# Patient Record
Sex: Male | Born: 1950
Health system: Southern US, Community
[De-identification: ages and names within clinical notes are randomized; demographics above are authoritative.]

## PROBLEM LIST (undated history)

## (undated) DIAGNOSIS — K760 Fatty (change of) liver, not elsewhere classified: Secondary | ICD-10-CM

## (undated) DIAGNOSIS — E785 Hyperlipidemia, unspecified: Secondary | ICD-10-CM

## (undated) DIAGNOSIS — I700 Atherosclerosis of aorta: Secondary | ICD-10-CM

## (undated) DIAGNOSIS — G4730 Sleep apnea, unspecified: Secondary | ICD-10-CM

## (undated) DIAGNOSIS — K219 Gastro-esophageal reflux disease without esophagitis: Secondary | ICD-10-CM

## (undated) DIAGNOSIS — I10 Essential (primary) hypertension: Secondary | ICD-10-CM

## (undated) HISTORY — DX: Hyperlipidemia, unspecified: E78.5

## (undated) HISTORY — PX: TONSILLECTOMY: SUR1361

## (undated) HISTORY — DX: Fatty (change of) liver, not elsewhere classified: K76.0

## (undated) HISTORY — DX: Atherosclerosis of aorta: I70.0

## (undated) HISTORY — PX: UPPER GASTROINTESTINAL ENDOSCOPY: SHX188

## (undated) HISTORY — PX: COLONOSCOPY: SHX174

## (undated) HISTORY — DX: Sleep apnea, unspecified: G47.30

## (undated) HISTORY — DX: Gastro-esophageal reflux disease without esophagitis: K21.9

## (undated) HISTORY — DX: Essential (primary) hypertension: I10

---

## 1998-01-29 ENCOUNTER — Inpatient Hospital Stay (HOSPITAL_COMMUNITY): Admission: RE | Admit: 1998-01-29 | Discharge: 1998-02-02 | Payer: Self-pay | Admitting: *Deleted

## 2002-09-08 ENCOUNTER — Inpatient Hospital Stay (HOSPITAL_COMMUNITY): Admission: EM | Admit: 2002-09-08 | Discharge: 2002-09-12 | Payer: Self-pay | Admitting: Psychiatry

## 2012-07-16 ENCOUNTER — Emergency Department (HOSPITAL_BASED_OUTPATIENT_CLINIC_OR_DEPARTMENT_OTHER): Payer: BC Managed Care – PPO

## 2012-07-16 ENCOUNTER — Emergency Department (HOSPITAL_BASED_OUTPATIENT_CLINIC_OR_DEPARTMENT_OTHER)
Admission: EM | Admit: 2012-07-16 | Discharge: 2012-07-16 | Disposition: A | Discharge status: Home / Self Care | Payer: BC Managed Care – PPO | Attending: Emergency Medicine | Admitting: Emergency Medicine

## 2012-07-16 ENCOUNTER — Encounter (HOSPITAL_BASED_OUTPATIENT_CLINIC_OR_DEPARTMENT_OTHER): Payer: Self-pay | Admitting: *Deleted

## 2012-07-16 DIAGNOSIS — J209 Acute bronchitis, unspecified: Secondary | ICD-10-CM

## 2012-07-16 DIAGNOSIS — R0602 Shortness of breath: Secondary | ICD-10-CM | POA: Insufficient documentation

## 2012-07-16 DIAGNOSIS — Z87891 Personal history of nicotine dependence: Secondary | ICD-10-CM | POA: Insufficient documentation

## 2012-07-16 LAB — CBC WITH DIFFERENTIAL/PLATELET
Basophils Relative: 1 % (ref 0–1)
Eosinophils Absolute: 0.2 10*3/uL (ref 0.0–0.7)
HCT: 47.5 % (ref 39.0–52.0)
Hemoglobin: 17.7 g/dL — ABNORMAL HIGH (ref 13.0–17.0)
Lymphs Abs: 2.5 10*3/uL (ref 0.7–4.0)
MCH: 34.3 pg — ABNORMAL HIGH (ref 26.0–34.0)
MCHC: 37.3 g/dL — ABNORMAL HIGH (ref 30.0–36.0)
MCV: 92.1 fL (ref 78.0–100.0)
Monocytes Absolute: 1 10*3/uL (ref 0.1–1.0)
Monocytes Relative: 8 % (ref 3–12)
Neutrophils Relative %: 69 % (ref 43–77)
RBC: 5.16 MIL/uL (ref 4.22–5.81)

## 2012-07-16 LAB — COMPREHENSIVE METABOLIC PANEL
Albumin: 4.3 g/dL (ref 3.5–5.2)
Alkaline Phosphatase: 144 U/L — ABNORMAL HIGH (ref 39–117)
BUN: 9 mg/dL (ref 6–23)
Creatinine, Ser: 0.9 mg/dL (ref 0.50–1.35)
GFR calc Af Amer: >90 mL/min (ref >90)
Glucose, Bld: 91 mg/dL (ref 70–99)
Potassium: 4.3 mEq/L (ref 3.5–5.1)
Total Bilirubin: 0.7 mg/dL (ref 0.3–1.2)
Total Protein: 7.6 g/dL (ref 6.0–8.3)

## 2012-07-16 LAB — TROPONIN I: Troponin I: <0.3 ng/mL (ref <0.30)

## 2012-07-16 LAB — PRO B NATRIURETIC PEPTIDE: Pro B Natriuretic peptide (BNP): 57.2 pg/mL (ref 0–125)

## 2012-07-16 MED ORDER — LEVOFLOXACIN 750 MG PO TABS: 750.0000 mg | ORAL_TABLET | Freq: Every day | ORAL | Status: DC

## 2012-07-16 MED ORDER — PREDNISONE 10 MG PO TABS: 20.0000 mg | ORAL_TABLET | Freq: Two times a day (BID) | ORAL | Status: DC

## 2012-07-16 MED ORDER — METHYLPREDNISOLONE SODIUM SUCC 125 MG IJ SOLR
125.0000 mg | Freq: Once | INTRAMUSCULAR | Status: AC
  Administered 2012-07-16: 125 mg via INTRAVENOUS
  Filled 2012-07-16: qty 2

## 2012-07-16 MED ORDER — HYDROCOD POLST-CHLORPHEN POLST 10-8 MG/5ML PO LQCR: 5.0000 mL | Freq: Two times a day (BID) | ORAL | Status: DC | PRN

## 2012-07-16 NOTE — ED Notes (Signed)
Patient c/o non-productive cough & weakness, just completed amoxicillin and was given kenalog injection approx two weeks ago. He states he is easily fatigued. Felt a little better when taking the meds, but feels he is getting worse, no fever that he is aware of. Some chills at night last week

## 2012-07-16 NOTE — ED Provider Notes (Signed)
History     CSN: [id number]  Arrival date & time 07/16/12  1325   First MD Initiated Contact with Patient 07/16/12 1338      Chief Complaint  Patient presents with  . Cough    (Consider location/radiation/quality/duration/timing/severity/associated sxs/prior treatment) HPI Comments: Patient presents with complaints of a one month history of cough that is non-productive.  He was seen by his pcp, told he likely had bronchitis, and was given an antibiotic for this.  This did not help.  He continues to feel weak and fatigued and is if he has no energy.  It is worse when he gets up to ambulate.  He complains of feeling short of breath and that his legs are weak.    Patient is a 62 y.o. male presenting with cough. The history is provided by the patient.  Cough Cough characteristics:  Non-productive Severity:  Moderate Onset quality:  Gradual Duration:  4 weeks Timing:  Constant Progression:  Worsening Chronicity:  New Smoker: no   Context: fumes   Relieved by:  Nothing Worsened by:  Nothing tried Ineffective treatments:  Cough suppressants and decongestant Associated symptoms: shortness of breath   Associated symptoms: no chest pain, no chills and no fever     History reviewed. No pertinent past medical history.  Past Surgical History  Procedure Laterality Date  . Tonsillectomy      No family history on file.  History  Substance Use Topics  . Smoking status: Former Games developer  . Smokeless tobacco: Not on file  . Alcohol Use: Yes      Review of Systems  Constitutional: Positive for fatigue. Negative for fever, chills and appetite change.  Respiratory: Positive for cough and shortness of breath.   Cardiovascular: Negative for chest pain.  All other systems reviewed and are negative.    Allergies  Review of patient's allergies indicates no known allergies.  Home Medications   Current Outpatient Rx  Name  Route  Sig  Dispense  Refill  . MILK THISTLE PO   Oral  Take by mouth.         . Red Yeast Rice Extract (RED YEAST RICE PO)   Oral   Take by mouth.           BP 180/94  Pulse 83  Temp(Src) 98.2 F (36.8 C)  Resp 19  SpO2 99%  Physical Exam  Nursing note and vitals reviewed. Constitutional: He is oriented to person, place, and time. He appears well-developed and well-nourished. No distress.  HENT:  Head: Normocephalic and atraumatic.  Mouth/Throat: Oropharynx is clear and moist.  Neck: Normal range of motion. Neck supple.  Cardiovascular: Normal rate, regular rhythm and intact distal pulses.   No murmur heard. Pulmonary/Chest: Effort normal and breath sounds normal. No respiratory distress. He has no wheezes.  Abdominal: Soft. Bowel sounds are normal. He exhibits no distension. There is no tenderness.  Musculoskeletal: Normal range of motion. He exhibits no edema.  Lymphadenopathy:    He has no cervical adenopathy.  Neurological: He is alert and oriented to person, place, and time.  Skin: Skin is warm and dry. He is not diaphoretic.    ED Course  Procedures (including critical care time)  Labs Reviewed  CBC WITH DIFFERENTIAL  COMPREHENSIVE METABOLIC PANEL  TROPONIN I  PRO B NATRIURETIC PEPTIDE   Dg Chest 2 View  07/16/2012   *RADIOLOGY REPORT*  Clinical Data: Cough.  CHEST - 2 VIEW  Comparison:  None.  Findings:  The heart  size and mediastinal contours are within normal limits.  Both lungs are clear.  The visualized skeletal structures are unremarkable.  IMPRESSION: No active cardiopulmonary disease.   Original Report Authenticated By: Myles Rosenthal, M.D.     No diagnosis found.   Date: 07/16/2012  Rate: 81  Rhythm: normal sinus rhythm  QRS Axis: normal  Intervals: normal  ST/T Wave abnormalities: normal  Conduction Disutrbances:none  Narrative Interpretation:   Old EKG Reviewed: none available    MDM  The patient presents with complaints of cough, chest congestion.  The workup reveals a mild wbc elevation,  but no fever, infiltrate on chest xray.  There was also no elevation of bnp or troponin and I doubt a cardiac etiology.  Will treat with levaquin, prednisone, tussionex.        Geoffery Lyons, MD 07/16/12 1520

## 2014-09-05 ENCOUNTER — Other Ambulatory Visit: Payer: Self-pay | Admitting: Surgery

## 2014-11-30 ENCOUNTER — Other Ambulatory Visit: Payer: Self-pay | Admitting: Internal Medicine

## 2014-11-30 ENCOUNTER — Ambulatory Visit
Admission: RE | Admit: 2014-11-30 | Discharge: 2014-11-30 | Disposition: A | Discharge status: Home / Self Care | Payer: BLUE CROSS/BLUE SHIELD | Source: Ambulatory Visit | Attending: Internal Medicine | Admitting: Internal Medicine

## 2014-11-30 DIAGNOSIS — J209 Acute bronchitis, unspecified: Secondary | ICD-10-CM

## 2014-11-30 DIAGNOSIS — R945 Abnormal results of liver function studies: Principal | ICD-10-CM

## 2014-11-30 DIAGNOSIS — R7989 Other specified abnormal findings of blood chemistry: Secondary | ICD-10-CM

## 2014-12-03 ENCOUNTER — Ambulatory Visit
Admission: RE | Admit: 2014-12-03 | Discharge: 2014-12-03 | Disposition: A | Discharge status: Home / Self Care | Payer: BLUE CROSS/BLUE SHIELD | Source: Ambulatory Visit | Attending: Internal Medicine | Admitting: Internal Medicine

## 2014-12-03 DIAGNOSIS — R7989 Other specified abnormal findings of blood chemistry: Secondary | ICD-10-CM

## 2014-12-03 DIAGNOSIS — R945 Abnormal results of liver function studies: Principal | ICD-10-CM

## 2016-03-31 IMAGING — US Abdomen
1 series · 14 of 16 positions shown · non-contrast
Comparison: None.

CLINICAL DATA: Elevated LFTs.

EXAM:
ULTRASOUND ABDOMEN COMPLETE

[Series 1: abdomen · 0.33mm/px · 14 of 87 slices shown]
[im 1/87]
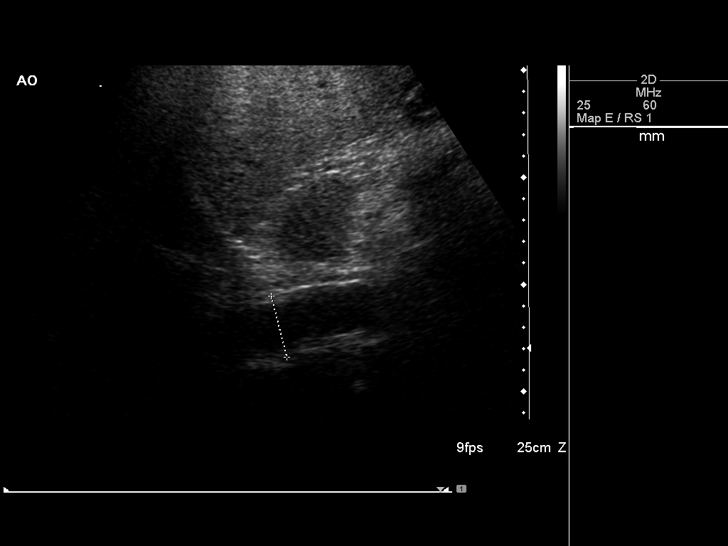
[im 6/87]
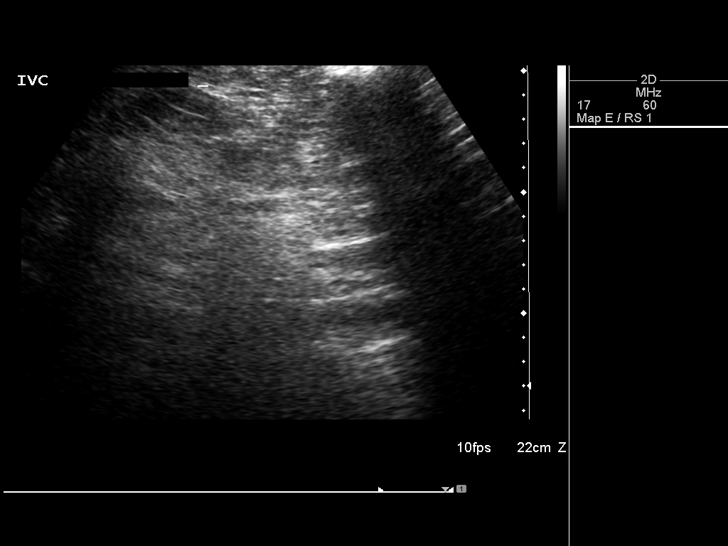
[im 12/87]
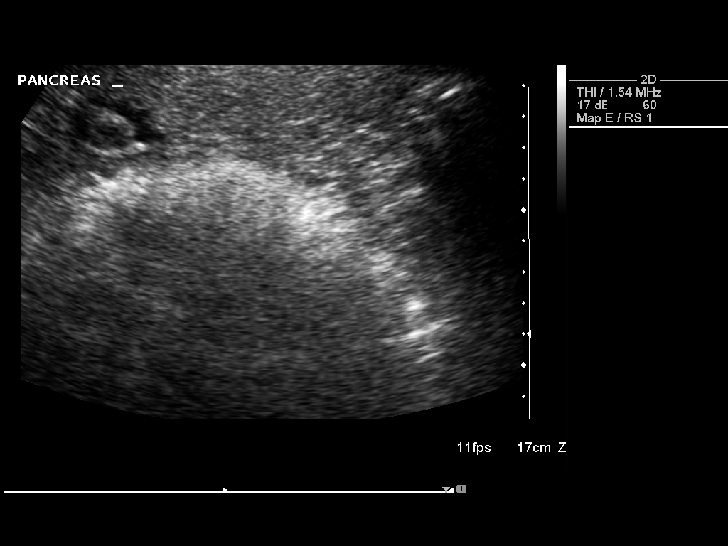
[im 23/87]
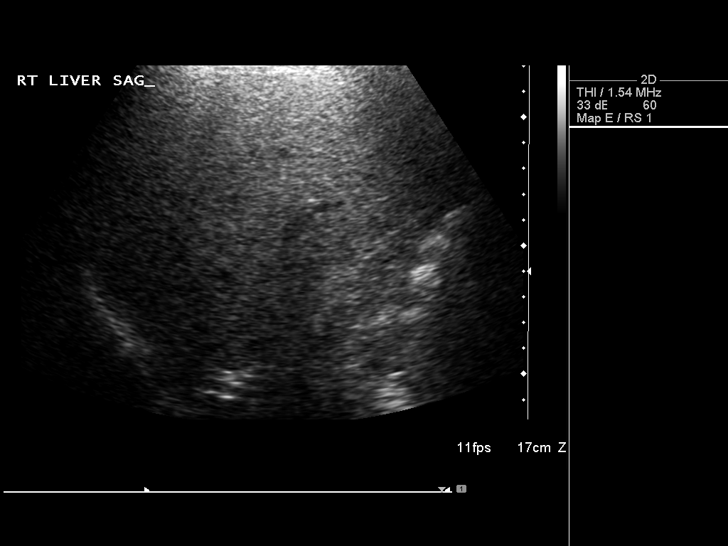
[im 29/87]
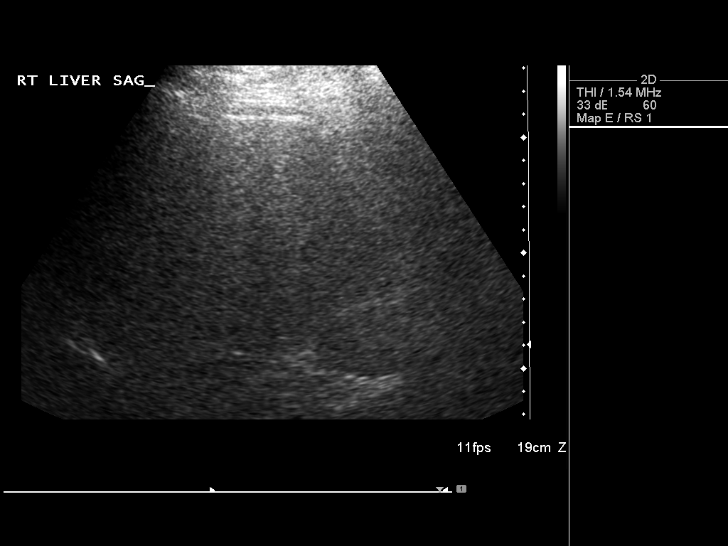
[im 35/87]
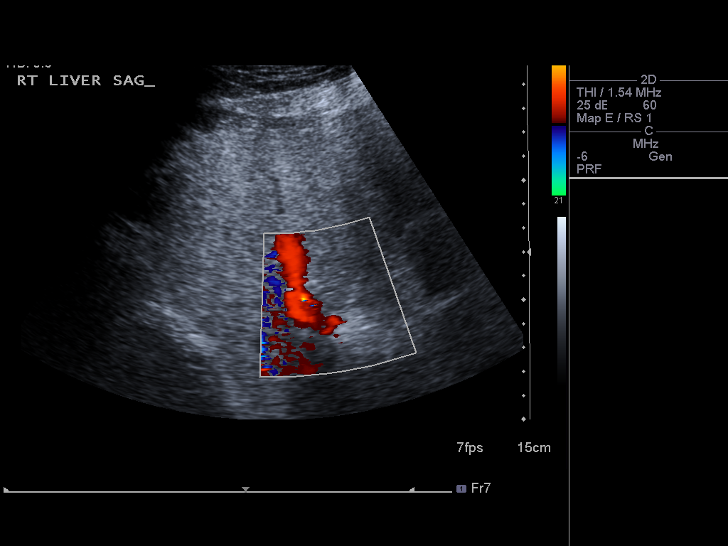
[im 41/87]
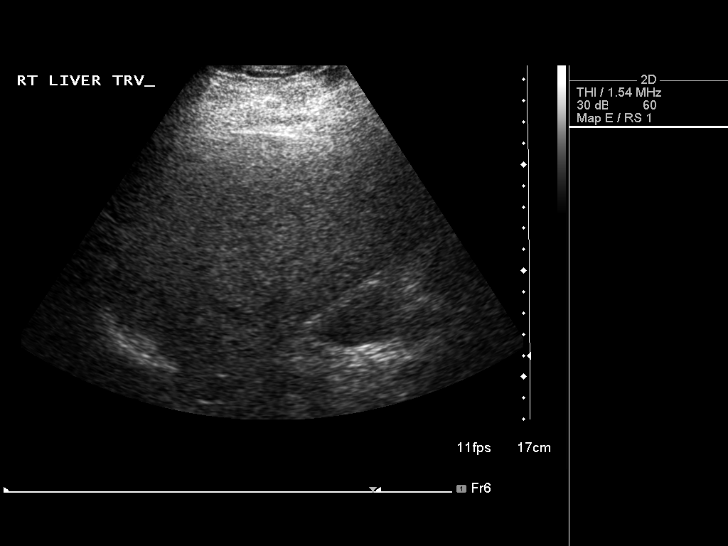
[im 46/87]
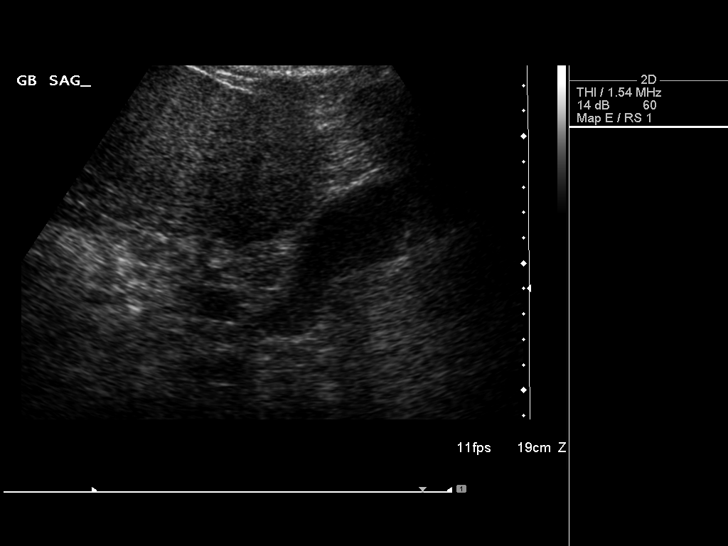
[im 52/87]
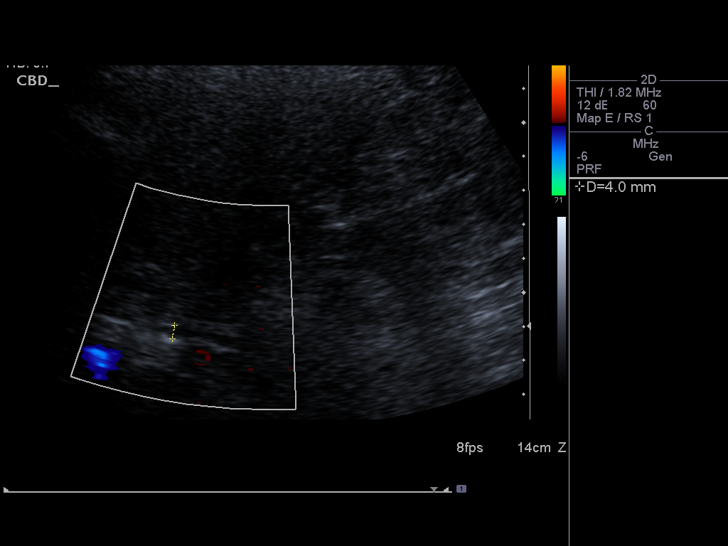
[im 58/87]
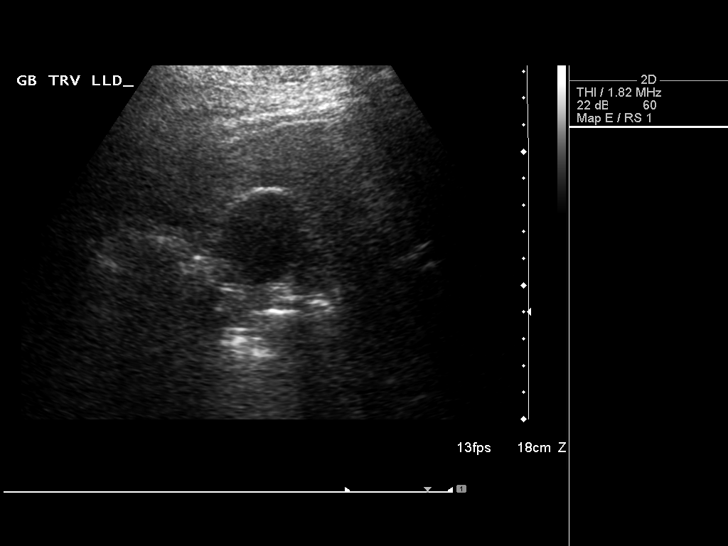
[im 69/87]
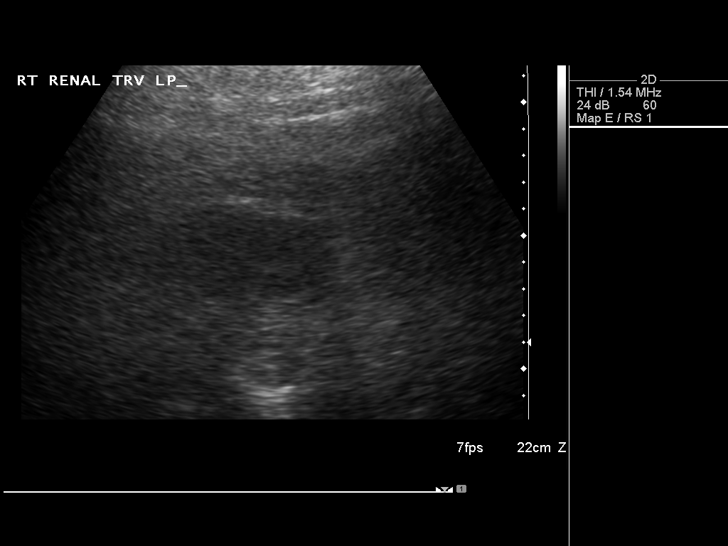
[im 75/87]
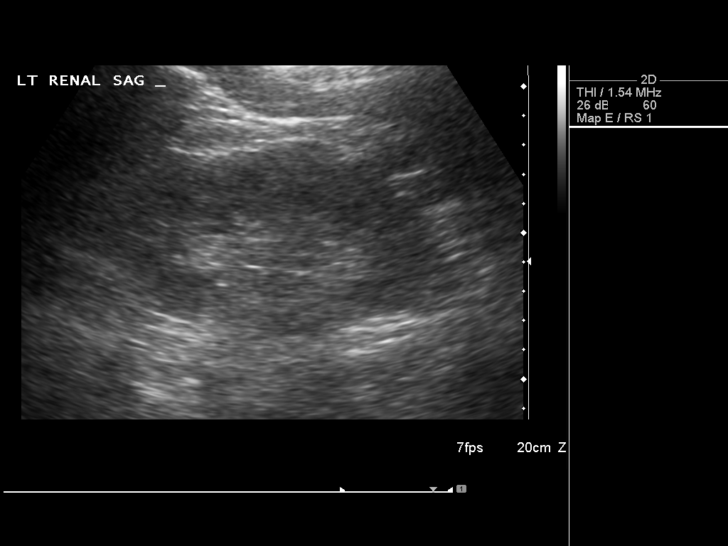
[im 81/87]
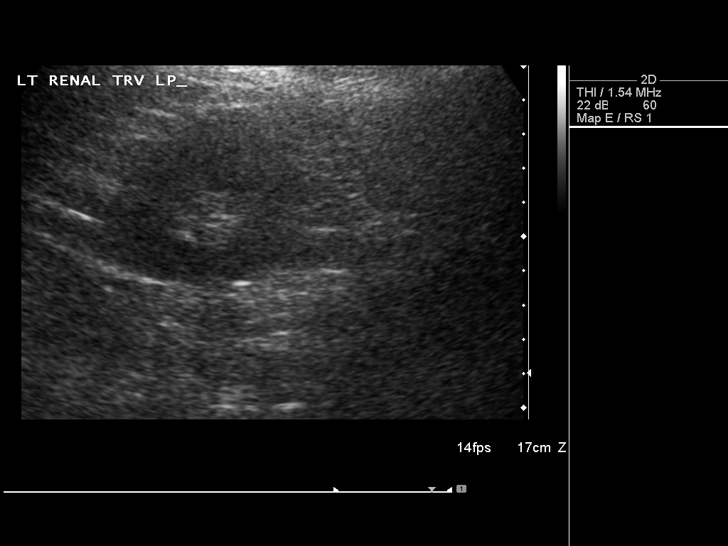
[im 87/87]
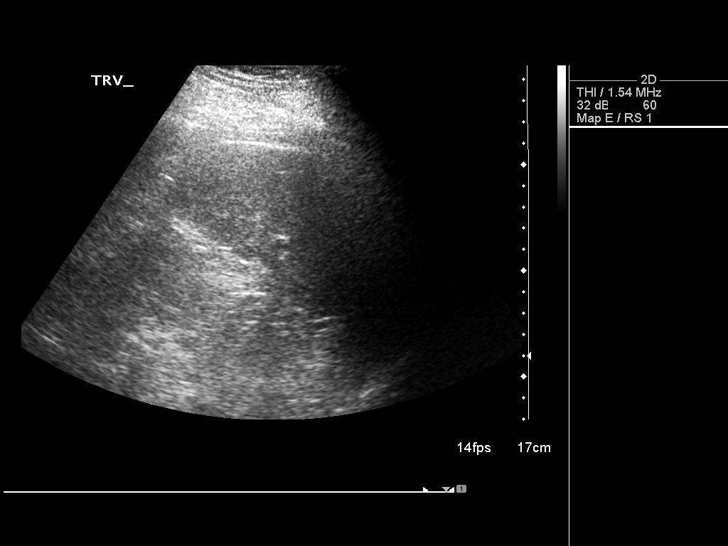

[14 of 16 positions shown; findings below may reference images not displayed]

FINDINGS: Gallbladder: No gallstones or wall thickening visualized. No
sonographic Murphy sign noted.

Common bile duct: Diameter: 4.0 mm

Liver: Liver is echogenic consistent with fatty infiltration and/or
hepatocellular disease. No focal hepatic abnormalities identified .

IVC: No abnormality visualized.

Pancreas: Visualized portion unremarkable.

Spleen: Size and appearance within normal limits.

Right Kidney: Length: 11.4 cm. Echogenicity within normal limits. No
mass or hydronephrosis visualized.

Left Kidney: Length: 11.9 cm. Echogenicity within normal limits. No
mass or hydronephrosis visualized.

Abdominal aorta: No aneurysm visualized.

Other findings: None.
IMPRESSION: Echogenic liver consistent with fatty infiltration and/or
hepatocellular disease. No focal abnormality identified.

## 2016-05-28 ENCOUNTER — Ambulatory Visit (INDEPENDENT_AMBULATORY_CARE_PROVIDER_SITE_OTHER): Payer: 59

## 2016-05-28 ENCOUNTER — Ambulatory Visit (INDEPENDENT_AMBULATORY_CARE_PROVIDER_SITE_OTHER): Payer: 59 | Admitting: Physician Assistant

## 2016-05-28 ENCOUNTER — Encounter (INDEPENDENT_AMBULATORY_CARE_PROVIDER_SITE_OTHER): Payer: Self-pay | Admitting: Physician Assistant

## 2016-05-28 DIAGNOSIS — S9031XA Contusion of right foot, initial encounter: Secondary | ICD-10-CM

## 2016-05-28 DIAGNOSIS — M79671 Pain in right foot: Secondary | ICD-10-CM | POA: Diagnosis not present

## 2016-05-28 NOTE — Progress Notes (Signed)
Office Visit Note   Patient: Cameron Woods           Date of Birth: 1951-01-22           MRN: [id number] Visit Date: 05/28/2016              Requested by: Shellia Cleverly, PA [address] [place], [place] [address] PCP: Virl Cagey, NP   Assessment & Plan: Visit Diagnoses:  1. Pain in right foot   2. Contusion of foot or heel, right, initial encounter     Plan: We have placed him in a postop shoe to wear this whenever he is up ambulating. Ice and elevation. Follow-up in 2 weeks for reevaluation of these continues to have pain we'll obtain 3 views of the right foot to rule out occult fracture.  Follow-Up Instructions: Return in about 2 weeks (around 06/11/2016).   Orders:  Orders Placed This Encounter  Procedures  . XR Foot Complete Right   No orders of the defined types were placed in this encounter.     Procedures: No procedures performed   Clinical Data: No additional findings.   Subjective: Chief Complaint  Patient presents with  . Right Foot - Pain    HPI Cameron Woods is a 66 year old male comes in today for right foot pain. He reports that he dropped a log on his foot and injured it while cutting a tree up. This occurred last last Friday. Pain is been worse with ambulation. He states that initially the swelling was significant and it felt as if the foot had a fever and his wife states that there is some redness in the foot. He states swelling is gone down. He was having pain mostly on the dorsal aspect of the foot.  Past medical history: positive for acid reflux pneumonia and sleep apnea  Review of Systems No chest pain shortness breath fevers chills.  Objective: Vital Signs: There were no vitals taken for this visit.  Physical Exam  Constitutional: He is oriented to person, place, and time. He appears well-developed and well-nourished.  Cardiovascular: Intact distal pulses.   Pulmonary/Chest: Effort normal.  Neurological: He is alert and oriented to  person, place, and time.  Skin: Skin is warm and dry.  Psychiatric: He has a normal mood and affect.    Ortho Exam Right foot no ecchymosis erythema is mild edema dorsal aspect of the right foot. There is no skin abrasions impending ulcers or rashes. He has tenderness over the right first metatarsal mid shaft. Remainder the foot is nontender. He is nontender over the sesamoids. Is able to dorsiflex plantar flex foot. Bilateral feet with 5 out of 5 strengths inversion eversion, EHL against resistance. Specialty Comments:  No specialty comments available.  Imaging: Xr Foot Complete Right  Result Date: 05/28/2016 AP lateral and oblique views of the right foot: No acute fractures. Bilateral sesamoids bipartite. Lisfranc joint is well maintained. No bony abnormalities otherwise.    PMFS History: There are no active problems to display for this patient.  No past medical history on file.  No family history on file.  Past Surgical History:  Procedure Laterality Date  . TONSILLECTOMY     Social History   Occupational History  . Not on file.   Social History Main Topics  . Smoking status: Former Games developer  . Smokeless tobacco: Never Used  . Alcohol use Yes  . Drug use: No  . Sexual activity: Not on file

## 2016-06-11 ENCOUNTER — Ambulatory Visit (INDEPENDENT_AMBULATORY_CARE_PROVIDER_SITE_OTHER): Payer: 59 | Admitting: Physician Assistant

## 2016-10-12 ENCOUNTER — Ambulatory Visit (INDEPENDENT_AMBULATORY_CARE_PROVIDER_SITE_OTHER): Payer: 59 | Admitting: Orthopaedic Surgery

## 2016-10-14 ENCOUNTER — Ambulatory Visit (INDEPENDENT_AMBULATORY_CARE_PROVIDER_SITE_OTHER): Payer: 59 | Admitting: Orthopaedic Surgery

## 2018-02-24 DIAGNOSIS — G4733 Obstructive sleep apnea (adult) (pediatric): Secondary | ICD-10-CM | POA: Diagnosis not present

## 2018-03-18 DIAGNOSIS — K219 Gastro-esophageal reflux disease without esophagitis: Secondary | ICD-10-CM | POA: Diagnosis not present

## 2018-03-18 DIAGNOSIS — K2270 Barrett's esophagus without dysplasia: Secondary | ICD-10-CM | POA: Diagnosis not present

## 2018-03-18 DIAGNOSIS — K2900 Acute gastritis without bleeding: Secondary | ICD-10-CM | POA: Diagnosis not present

## 2018-03-22 DIAGNOSIS — K2270 Barrett's esophagus without dysplasia: Secondary | ICD-10-CM | POA: Diagnosis not present

## 2018-03-28 DIAGNOSIS — F324 Major depressive disorder, single episode, in partial remission: Secondary | ICD-10-CM | POA: Diagnosis not present

## 2018-03-28 DIAGNOSIS — I10 Essential (primary) hypertension: Secondary | ICD-10-CM | POA: Diagnosis not present

## 2018-03-28 DIAGNOSIS — J301 Allergic rhinitis due to pollen: Secondary | ICD-10-CM | POA: Diagnosis not present

## 2018-05-12 DIAGNOSIS — G4733 Obstructive sleep apnea (adult) (pediatric): Secondary | ICD-10-CM | POA: Diagnosis not present

## 2018-07-04 DIAGNOSIS — R972 Elevated prostate specific antigen [PSA]: Secondary | ICD-10-CM | POA: Diagnosis not present

## 2018-07-12 DIAGNOSIS — Z8042 Family history of malignant neoplasm of prostate: Secondary | ICD-10-CM | POA: Diagnosis not present

## 2018-07-12 DIAGNOSIS — R972 Elevated prostate specific antigen [PSA]: Secondary | ICD-10-CM | POA: Diagnosis not present

## 2018-08-03 DIAGNOSIS — G4733 Obstructive sleep apnea (adult) (pediatric): Secondary | ICD-10-CM | POA: Diagnosis not present

## 2018-08-03 DIAGNOSIS — G4761 Periodic limb movement disorder: Secondary | ICD-10-CM | POA: Diagnosis not present

## 2018-11-09 ENCOUNTER — Encounter: Payer: Self-pay | Admitting: Family Medicine

## 2018-11-09 DIAGNOSIS — E559 Vitamin D deficiency, unspecified: Secondary | ICD-10-CM | POA: Diagnosis not present

## 2018-11-09 DIAGNOSIS — I10 Essential (primary) hypertension: Secondary | ICD-10-CM | POA: Diagnosis not present

## 2018-11-09 DIAGNOSIS — G2581 Restless legs syndrome: Secondary | ICD-10-CM | POA: Diagnosis not present

## 2018-11-09 DIAGNOSIS — K219 Gastro-esophageal reflux disease without esophagitis: Secondary | ICD-10-CM | POA: Diagnosis not present

## 2018-11-09 DIAGNOSIS — G4733 Obstructive sleep apnea (adult) (pediatric): Secondary | ICD-10-CM | POA: Diagnosis not present

## 2018-11-09 DIAGNOSIS — F324 Major depressive disorder, single episode, in partial remission: Secondary | ICD-10-CM | POA: Diagnosis not present

## 2018-11-09 DIAGNOSIS — E785 Hyperlipidemia, unspecified: Secondary | ICD-10-CM | POA: Diagnosis not present

## 2018-11-09 DIAGNOSIS — Z0000 Encounter for general adult medical examination without abnormal findings: Secondary | ICD-10-CM | POA: Diagnosis not present

## 2018-11-09 DIAGNOSIS — Z23 Encounter for immunization: Secondary | ICD-10-CM | POA: Diagnosis not present

## 2018-11-22 DIAGNOSIS — H11012 Amyloid pterygium of left eye: Secondary | ICD-10-CM | POA: Diagnosis not present

## 2018-11-22 DIAGNOSIS — H5203 Hypermetropia, bilateral: Secondary | ICD-10-CM | POA: Diagnosis not present

## 2018-12-13 DIAGNOSIS — R748 Abnormal levels of other serum enzymes: Secondary | ICD-10-CM | POA: Diagnosis not present

## 2018-12-13 DIAGNOSIS — E785 Hyperlipidemia, unspecified: Secondary | ICD-10-CM | POA: Diagnosis not present

## 2018-12-13 DIAGNOSIS — I10 Essential (primary) hypertension: Secondary | ICD-10-CM | POA: Diagnosis not present

## 2018-12-31 DIAGNOSIS — Z20828 Contact with and (suspected) exposure to other viral communicable diseases: Secondary | ICD-10-CM | POA: Diagnosis not present

## 2018-12-31 DIAGNOSIS — R05 Cough: Secondary | ICD-10-CM | POA: Diagnosis not present

## 2019-02-09 DIAGNOSIS — G4733 Obstructive sleep apnea (adult) (pediatric): Secondary | ICD-10-CM | POA: Diagnosis not present

## 2019-02-09 DIAGNOSIS — G4761 Periodic limb movement disorder: Secondary | ICD-10-CM | POA: Diagnosis not present

## 2019-02-13 DIAGNOSIS — R748 Abnormal levels of other serum enzymes: Secondary | ICD-10-CM | POA: Diagnosis not present

## 2019-02-13 DIAGNOSIS — I10 Essential (primary) hypertension: Secondary | ICD-10-CM | POA: Diagnosis not present

## 2019-02-13 DIAGNOSIS — G4733 Obstructive sleep apnea (adult) (pediatric): Secondary | ICD-10-CM | POA: Diagnosis not present

## 2019-02-13 DIAGNOSIS — R05 Cough: Secondary | ICD-10-CM | POA: Diagnosis not present

## 2019-03-20 DIAGNOSIS — E669 Obesity, unspecified: Secondary | ICD-10-CM | POA: Diagnosis not present

## 2019-03-20 DIAGNOSIS — I10 Essential (primary) hypertension: Secondary | ICD-10-CM | POA: Diagnosis not present

## 2019-03-20 DIAGNOSIS — G4733 Obstructive sleep apnea (adult) (pediatric): Secondary | ICD-10-CM | POA: Diagnosis not present

## 2019-05-24 ENCOUNTER — Ambulatory Visit: Payer: Self-pay

## 2019-05-24 ENCOUNTER — Other Ambulatory Visit: Payer: Self-pay

## 2019-05-24 ENCOUNTER — Ambulatory Visit: Payer: Medicare HMO | Admitting: Orthopaedic Surgery

## 2019-05-24 ENCOUNTER — Encounter: Payer: Self-pay | Admitting: Orthopaedic Surgery

## 2019-05-24 ENCOUNTER — Ambulatory Visit (INDEPENDENT_AMBULATORY_CARE_PROVIDER_SITE_OTHER): Payer: Medicare HMO

## 2019-05-24 DIAGNOSIS — G8929 Other chronic pain: Secondary | ICD-10-CM | POA: Diagnosis not present

## 2019-05-24 DIAGNOSIS — M25562 Pain in left knee: Secondary | ICD-10-CM

## 2019-05-24 DIAGNOSIS — M25561 Pain in right knee: Secondary | ICD-10-CM

## 2019-05-24 NOTE — Addendum Note (Signed)
Addended by: Richardean Canal on: 05/24/2019 11:37 AM   Modules accepted: Orders

## 2019-05-24 NOTE — Progress Notes (Signed)
Office Visit Note   Patient: Cameron Woods           Date of Birth: Sep 16, 1950           MRN: [id number] Visit Date: 05/24/2019              Requested by: No referring provider defined for this encounter. PCP: Cameron Lapping, NP (Inactive)   Assessment & Plan: Visit Diagnoses:  1. Chronic pain of both knees     Plan: Recommend he work on Dance movement psychotherapist.  He does not feel as if he wants a cortisone injection at this point in time.  Therefore having to use Voltaren gel on his knees up to 4 times daily.  Work on Dance movement psychotherapist.  Follow-up leg pain becomes worse or he develops any mechanical symptoms which are reviewed with him.  Quad strengthening exercises reviewed with him along with knee friendly exercises were discussed.  Follow-Up Instructions: Return if symptoms worsen or fail to improve.   Orders:  Orders Placed This Encounter  Procedures  . XR Knee 1-2 Views Right  . XR Knee 1-2 Views Left   No orders of the defined types were placed in this encounter.     Procedures: No procedures performed   Clinical Data: No additional findings.   Subjective: Chief Complaint  Patient presents with  . Left Knee - Pain  . Right Knee - Pain    HPI  [name] is someone we have not seen him last 2 years.  He comes in today with bilateral knee pain.  He states the pain is been going on for several years however over the last 2 months it became worse particularly at night.  Pain is anterior medial aspect knees.  The denies any mechanical symptoms of either knee.  Pain is worse at the end of the day.  Initially was taking Neurontin which helped.  Now using Biofreeze which is helping some with his knee pain.  Said no swelling of either knee.  He did leg brace for some 35 years.  No recent fevers chills shortness of breath chest pain. Review of Systems See HPI.  Objective: Vital Signs: There were no vitals taken for this visit.  Physical Exam Constitutional:    Appearance: He is not ill-appearing or diaphoretic.  Pulmonary:     Effort: Pulmonary effort is normal.  Neurological:     Mental Status: He is alert and oriented to person, place, and time.  Psychiatric:        Mood and Affect: Mood normal.     Ortho Exam Bilateral knees good range of motion patellofemoral crepitus with passive range of motion both knees.  No instability valgus varus stressing of either knee.  McMurray's is negative bilaterally.  Nontender along medial lateral joint line of both knees.  No abnormal warmth erythema or effusion bilateral knees. Specialty Comments:  No specialty comments available.  Imaging: XR Knee 1-2 Views Left  Result Date: 05/24/2019 Left knee: No acute fractures.  Knee is well located.  Medial lateral joint lines well-maintained.  Mild patellofemoral changes.  Otherwise no bony abnormalities.  XR Knee 1-2 Views Right  Result Date: 05/24/2019 Right knee: Medial lateral joint line well-maintained.  No acute fractures.  Mild patellofemoral changes.  Knee is well located.    PMFS History: There are no problems to display for this patient.  History reviewed. No pertinent past medical history.  History reviewed. No pertinent family history.  Past Surgical History:  Procedure Laterality  Date  . TONSILLECTOMY     Social History   Occupational History  . Not on file  Tobacco Use  . Smoking status: Former Research scientist (life sciences)  . Smokeless tobacco: Never Used  Substance and Sexual Activity  . Alcohol use: Yes  . Drug use: No  . Sexual activity: Not on file

## 2019-07-19 ENCOUNTER — Other Ambulatory Visit: Payer: Self-pay

## 2019-07-19 ENCOUNTER — Encounter: Payer: Self-pay | Admitting: Family Medicine

## 2019-07-19 ENCOUNTER — Ambulatory Visit (INDEPENDENT_AMBULATORY_CARE_PROVIDER_SITE_OTHER): Payer: Medicare HMO | Admitting: Family Medicine

## 2019-07-19 VITALS — BP 142/82 | HR 77 | Temp 97.0°F | Ht 65.5 in | Wt 215.1 lb

## 2019-07-19 DIAGNOSIS — M25562 Pain in left knee: Secondary | ICD-10-CM | POA: Diagnosis not present

## 2019-07-19 DIAGNOSIS — M25561 Pain in right knee: Secondary | ICD-10-CM

## 2019-07-19 DIAGNOSIS — G8929 Other chronic pain: Secondary | ICD-10-CM | POA: Diagnosis not present

## 2019-07-19 DIAGNOSIS — Z1159 Encounter for screening for other viral diseases: Secondary | ICD-10-CM

## 2019-07-19 DIAGNOSIS — E785 Hyperlipidemia, unspecified: Secondary | ICD-10-CM | POA: Diagnosis not present

## 2019-07-19 DIAGNOSIS — I10 Essential (primary) hypertension: Secondary | ICD-10-CM | POA: Insufficient documentation

## 2019-07-19 DIAGNOSIS — K219 Gastro-esophageal reflux disease without esophagitis: Secondary | ICD-10-CM | POA: Insufficient documentation

## 2019-07-19 DIAGNOSIS — G4733 Obstructive sleep apnea (adult) (pediatric): Secondary | ICD-10-CM | POA: Diagnosis not present

## 2019-07-19 DIAGNOSIS — Z9989 Dependence on other enabling machines and devices: Secondary | ICD-10-CM | POA: Diagnosis not present

## 2019-07-19 LAB — COMPREHENSIVE METABOLIC PANEL
ALT: 44 U/L (ref 0–53)
AST: 45 U/L — ABNORMAL HIGH (ref 0–37)
Albumin: 4.4 g/dL (ref 3.5–5.2)
Alkaline Phosphatase: 146 U/L — ABNORMAL HIGH (ref 39–117)
BUN: 15 mg/dL (ref 6–23)
CO2: 29 mEq/L (ref 19–32)
Calcium: 9.4 mg/dL (ref 8.4–10.5)
Chloride: 105 mEq/L (ref 96–112)
Creatinine, Ser: 0.95 mg/dL (ref 0.40–1.50)
GFR: 79.13 mL/min (ref >60.00)
Glucose, Bld: 97 mg/dL (ref 70–99)
Potassium: 4.4 mEq/L (ref 3.5–5.1)
Sodium: 138 mEq/L (ref 135–145)
Total Bilirubin: 1.1 mg/dL (ref 0.2–1.2)
Total Protein: 6.8 g/dL (ref 6.0–8.3)

## 2019-07-19 LAB — LIPID PANEL
Cholesterol: 216 mg/dL — ABNORMAL HIGH (ref 0–200)
HDL: 33.4 mg/dL — ABNORMAL LOW (ref >39.00)
LDL Cholesterol: 152 mg/dL — ABNORMAL HIGH (ref 0–99)
NonHDL: 182.3
Total CHOL/HDL Ratio: 6
Triglycerides: 150 mg/dL — ABNORMAL HIGH (ref 0.0–149.0)
VLDL: 30 mg/dL (ref 0.0–40.0)

## 2019-07-19 NOTE — Patient Instructions (Addendum)
Keep the diet clean and stay active.  Continue to monitor your blood pressure at home intermittently. I want your levels to be consistently <150/90. Come see me if they are not.   Let me know if mood issues start giving you issues.   Give [place] 2-3 business days to get the results of your labs back.   OK to use Debrox (peroxide) in the ear to loosen up wax. Also recommend using a bulb syringe (for removing boogers from baby's noses) to flush through warm water. Do not use Q-tips as this can impact wax further.  Let me know in 1 month if your knees aren't any better. We can do an injection or set you up with physical therapy (or both).  Let us know if you need anything.  Knee Exercises It is normal to feel mild stretching, pulling, tightness, or discomfort as you do these exercises, but you should stop right away if you feel sudden pain or your pain gets worse. STRETCHING AND RANGE OF MOTION EXERCISES  These exercises warm up your muscles and joints and improve the movement and flexibility of your knee. These exercises also help to relieve pain, numbness, and tingling. Exercise A: Knee Extension, Prone  1. Lie on your abdomen on a bed. 2. Place your left / right knee just beyond the edge of the surface so your knee is not on the bed. You can put a towel under your left / right thigh just above your knee for comfort. 3. Relax your leg muscles and allow gravity to straighten your knee. You should feel a stretch behind your left / right knee. 4. Hold this position for 30 seconds. 5. Scoot up so your knee is supported between repetitions. Repeat 2 times. Complete this stretch 3 times per week. Exercise B: Knee Flexion, Active    1. Lie on your back with both knees straight. If this causes back discomfort, bend your left / right knee so your foot is flat on the floor. 2. Slowly slide your left / right heel back toward your buttocks until you feel a gentle stretch in the front of your knee or  thigh. 3. Hold this position for 30 seconds. 4. Slowly slide your left / right heel back to the starting position. Repeat 2 times. Complete this exercise 3 times per week. Exercise C: Quadriceps, Prone    1. Lie on your abdomen on a firm surface, such as a bed or padded floor. 2. Bend your left / right knee and hold your ankle. If you cannot reach your ankle or pant leg, loop a belt around your foot and grab the belt instead. 3. Gently pull your heel toward your buttocks. Your knee should not slide out to the side. You should feel a stretch in the front of your thigh and knee. 4. Hold this position for 30 seconds. Repeat 2 times. Complete this stretch 3 times per week. Exercise D: Hamstring, Supine  1. Lie on your back. 2. Loop a belt or towel over the ball of your left / right foot. The ball of your foot is on the walking surface, right under your toes. 3. Straighten your left / right knee and slowly pull on the belt to raise your leg until you feel a gentle stretch behind your knee. ? Do not let your left / right knee bend while you do this. ? Keep your other leg flat on the floor. 4. Hold this position for 30 seconds. Repeat 2 times. Complete  this stretch 3 times per week. STRENGTHENING EXERCISES  These exercises build strength and endurance in your knee. Endurance is the ability to use your muscles for a long time, even after they get tired. Exercise E: Quadriceps, Isometric    1. Lie on your back with your left / right leg extended and your other knee bent. Put a rolled towel or small pillow under your knee if told by your health care provider. 2. Slowly tense the muscles in the front of your left / right thigh. You should see your kneecap slide up toward your hip or see increased dimpling just above the knee. This motion will push the back of the knee toward the floor. 3. For 3 seconds, keep the muscle as tight as you can without increasing your pain. 4. Relax the muscles slowly  and completely. Repeat for 10 total reps Repeat 2 ti mes. Complete this exercise 3 times per week. Exercise F: Straight Leg Raises - Quadriceps  1. Lie on your back with your left / right leg extended and your other knee bent. 2. Tense the muscles in the front of your left / right thigh. You should see your kneecap slide up or see increased dimpling just above the knee. Your thigh may even shake a bit. 3. Keep these muscles tight as you raise your leg 4-6 inches (10-15 cm) off the floor. Do not let your knee bend. 4. Hold this position for 3 seconds. 5. Keep these muscles tense as you lower your leg. 6. Relax your muscles slowly and completely after each repetition. 10 total reps. Repeat 2 times. Complete this exercise 3 times per week.  Exercise G: Hamstring Curls    If told by your health care provider, do this exercise while wearing ankle weights. Begin with 5 lb weights (optional). Then increase the weight by 1 lb (0.5 kg) increments. Do not wear ankle weights that are more than 20 lbs to start with. 1. Lie on your abdomen with your legs straight. 2. Bend your left / right knee as far as you can without feeling pain. Keep your hips flat against the floor. 3. Hold this position for 3 seconds. 4. Slowly lower your leg to the starting position. Repeat for 10 reps.  Repeat 2 times. Complete this exercise 3 times per week. Exercise H: Squats (Quadriceps)  1. Stand in front of a table, with your feet and knees pointing straight ahead. You may rest your hands on the table for balance but not for support. 2. Slowly bend your knees and lower your hips like you are going to sit in a chair. ? Keep your weight over your heels, not over your toes. ? Keep your lower legs upright so they are parallel with the table legs. ? Do not let your hips go lower than your knees. ? Do not bend lower than told by your health care provider. ? If your knee pain increases, do not bend as low. 3. Hold the squat  position for 1 second. 4. Slowly push with your legs to return to standing. Do not use your hands to pull yourself to standing. Repeat 2 times. Complete this exercise 3 times per week. Exercise I: Wall Slides (Quadriceps)    1. Lean your back against a smooth wall or door while you walk your feet out 18-24 inches (46-61 cm) from it. 2. Place your feet hip-width apart. 3. Slowly slide down the wall or door until your knees Repeat 2 times. Complete this exercise  every other day. 4. Exercise K: Straight Leg Raises - Hip Abductors  1. Lie on your side with your left / right leg in the top position. Lie so your head, shoulder, knee, and hip line up. You may bend your bottom knee to help you keep your balance. 2. Roll your hips slightly forward so your hips are stacked directly over each other and your left / right knee is facing forward. 3. Leading with your heel, lift your top leg 4-6 inches (10-15 cm). You should feel the muscles in your outer hip lifting. ? Do not let your foot drift forward. ? Do not let your knee roll toward the ceiling. 4. Hold this position for 3 seconds. 5. Slowly return your leg to the starting position. 6. Let your muscles relax completely after each repetition. 10 total reps. Repeat 2 times. Complete this exercise 3 times per week. Exercise J: Straight Leg Raises - Hip Extensors  1. Lie on your abdomen on a firm surface. You can put a pillow under your hips if that is more comfortable. 2. Tense the muscles in your buttocks and lift your left / right leg about 4-6 inches (10-15 cm). Keep your knee straight as you lift your leg. 3. Hold this position for 3 seconds. 4. Slowly lower your leg to the starting position. 5. Let your leg relax completely after each repetition. Repeat 2 times. Complete this exercise 3 times per week. Document Released: 12/03/2004 Document Revised: 10/14/2015 Document Reviewed: 11/25/2014 Elsevier Interactive Patient Education  2017 [employer].

## 2019-07-19 NOTE — Progress Notes (Signed)
Chief Complaint  Patient presents with  . New Patient (Initial Visit)  . Knee Pain    check your right ear  . Fatigue    no energy for years       New Patient Visit SUBJECTIVE: HPI: Cameron Woods is an 69 y.o.male who is being seen for establishing care.  The patient was previously seen at [facility].  HX of HTN on atenolol and losartan. He stopped meds and BP's running 130-140's/70-80's. Diet/exercise OK. No CP or SOB.  +b/l knee pain. 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 No need for replacement. Stretches/exercises provided which he reports compliance with. Standing for periods of time make things worse. He was rec'd to get PT, but did not do. Has not had injections.  +hx of depression on Wellbutrin after death of GF 3 yrs ago. Feels better since death overall, interested in coming off.  OSA on CPAP followed by Dr. Doreatha Massed. Rx'd Klonopin for calming before placing CPAP. Also on gabapentin for LE pain by same doc.   Hx of hyperlipidemia, stopped taking Lipitor.   Hx of silent reflux on Protonix.   Past Medical History:  Diagnosis Date  . Hyperlipidemia   . Hypertension   . Sleep apnea    Past Surgical History:  Procedure Laterality Date  . TONSILLECTOMY     Family History  Problem Relation Age of Onset  . Stroke Mother   . Cancer Father        prostate cancer  . Hypertension Sister   . Cancer Brother        prostate cancer   No Known Allergies  Current Outpatient Medications:  .  clonazePAM (KLONOPIN) 0.5 MG tablet, Take 0.5 mg by mouth., Disp: , Rfl:  .  gabapentin (NEURONTIN) 100 MG capsule, Take 200 mg by mouth at bedtime., Disp: , Rfl:  .  pantoprazole (PROTONIX) 40 MG tablet, Take 40 mg by mouth 2 (two) times daily., Disp: , Rfl:   OBJECTIVE: BP (!) 142/82 (BP Location: Left Arm, Patient Position: Sitting, Cuff Size: Normal)   Pulse 77   Temp (!) 97 F (36.1 C) (Temporal)   Ht 5' 5.5" (1.664 m)   Wt 215 lb 2 oz (97.6 kg)   SpO2 95%    BMI 35.25 kg/m  General:  well developed, well nourished, in no apparent distress Skin:  no significant moles, warts, or growths Nose:  nares patent, septum midline, mucosa normal Throat/Pharynx:  lips and gingiva without lesion; tongue and uvula midline; non-inflamed pharynx; no exudates or postnasal drainage Lungs:  clear to auscultation, breath sounds equal bilaterally, no respiratory distress Cardio:  regular rate and rhythm, no LE edema or bruits Musculoskeletal:  Knees- no ttp b/l, neg Lachman's, varus/valgus stress, Stine's, pat app/grind; no effusion or deformity Neuro:  gait normal Psych: well oriented with normal range of affect and appropriate judgment/insight  ASSESSMENT/PLAN: Essential hypertension - Plan: Comprehensive metabolic panel  Hyperlipidemia, unspecified hyperlipidemia type - Plan: Lipid panel, Comprehensive metabolic panel  Chronic pain of both knees  Gastroesophageal reflux disease, unspecified whether esophagitis present  OSA on CPAP  Encounter for hepatitis C screening test for low risk patient - Plan: Hepatitis C antibody  Patient instructed to sign release of records form from their previous PCP. Stop meds. Monitor BP. Counseled on diet/exercise. Will see labs before making rec on statin.  Stretches/exercises for knees. If no improvement, consider PT vs inj. Cont PPI. Cont meds per sleep team.  Patient should return in 6 mo for CPE or prn. The patient voiced understanding and agreement to the plan.   [place], DO 07/19/19  1:41 PM

## 2019-07-20 LAB — HEPATITIS C ANTIBODY
Hepatitis C Ab: NONREACTIVE
SIGNAL TO CUT-OFF: 0.01 (ref <1.00)

## 2019-07-21 ENCOUNTER — Other Ambulatory Visit: Payer: Self-pay | Admitting: Family Medicine

## 2019-07-21 MED ORDER — ROSUVASTATIN CALCIUM 20 MG PO TABS: 20.0000 mg | ORAL_TABLET | Freq: Every day | ORAL | 3 refills | Status: DC

## 2019-07-24 ENCOUNTER — Telehealth: Payer: Self-pay | Admitting: Family Medicine

## 2019-07-24 NOTE — Telephone Encounter (Signed)
Caller: Cameron Woods Call back phone number:[phone] - if doesn't answer phone is ok to leave a detail message.  Calling in regards to his lab results

## 2019-07-25 ENCOUNTER — Encounter: Payer: Self-pay | Admitting: Family Medicine

## 2019-07-25 ENCOUNTER — Other Ambulatory Visit: Payer: Self-pay

## 2019-07-25 DIAGNOSIS — K760 Fatty (change of) liver, not elsewhere classified: Secondary | ICD-10-CM | POA: Insufficient documentation

## 2019-07-25 DIAGNOSIS — R748 Abnormal levels of other serum enzymes: Secondary | ICD-10-CM

## 2019-08-08 DIAGNOSIS — R972 Elevated prostate specific antigen [PSA]: Secondary | ICD-10-CM | POA: Diagnosis not present

## 2019-08-15 DIAGNOSIS — R972 Elevated prostate specific antigen [PSA]: Secondary | ICD-10-CM | POA: Diagnosis not present

## 2019-08-17 DIAGNOSIS — G4733 Obstructive sleep apnea (adult) (pediatric): Secondary | ICD-10-CM | POA: Diagnosis not present

## 2019-08-17 DIAGNOSIS — G4761 Periodic limb movement disorder: Secondary | ICD-10-CM | POA: Diagnosis not present

## 2019-10-27 DIAGNOSIS — M545 Low back pain: Secondary | ICD-10-CM | POA: Diagnosis not present

## 2019-10-27 DIAGNOSIS — S339XXA Sprain of unspecified parts of lumbar spine and pelvis, initial encounter: Secondary | ICD-10-CM | POA: Diagnosis not present

## 2019-12-15 DIAGNOSIS — R972 Elevated prostate specific antigen [PSA]: Secondary | ICD-10-CM | POA: Diagnosis not present

## 2019-12-22 DIAGNOSIS — R972 Elevated prostate specific antigen [PSA]: Secondary | ICD-10-CM | POA: Diagnosis not present

## 2019-12-22 DIAGNOSIS — N400 Enlarged prostate without lower urinary tract symptoms: Secondary | ICD-10-CM | POA: Diagnosis not present

## 2019-12-22 DIAGNOSIS — N528 Other male erectile dysfunction: Secondary | ICD-10-CM | POA: Diagnosis not present

## 2020-01-23 ENCOUNTER — Encounter: Payer: Medicare HMO | Admitting: Family Medicine

## 2020-02-06 ENCOUNTER — Encounter: Payer: Self-pay | Admitting: Family Medicine

## 2020-02-06 ENCOUNTER — Other Ambulatory Visit: Payer: Self-pay

## 2020-02-06 ENCOUNTER — Ambulatory Visit (INDEPENDENT_AMBULATORY_CARE_PROVIDER_SITE_OTHER): Payer: Medicare HMO | Admitting: Family Medicine

## 2020-02-06 VITALS — BP 136/84 | HR 82 | Temp 99.0°F | Ht 66.0 in | Wt 224.1 lb

## 2020-02-06 DIAGNOSIS — Z136 Encounter for screening for cardiovascular disorders: Secondary | ICD-10-CM | POA: Diagnosis not present

## 2020-02-06 DIAGNOSIS — E559 Vitamin D deficiency, unspecified: Secondary | ICD-10-CM | POA: Diagnosis not present

## 2020-02-06 DIAGNOSIS — E785 Hyperlipidemia, unspecified: Secondary | ICD-10-CM

## 2020-02-06 DIAGNOSIS — Z0000 Encounter for general adult medical examination without abnormal findings: Secondary | ICD-10-CM

## 2020-02-06 NOTE — Progress Notes (Signed)
Chief Complaint  Patient presents with  . Annual Exam    Well Male Cameron Woods is here for a complete physical.   His last physical was >1 year ago.  Current diet: in general, a "healthy" diet.   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 No. Seat belt? Yes.    Health maintenance Shingrix- No Colonoscopy- Due Tetanus- Yes Hep C- Yes Pneumonia vaccine- Yes AAA screening- No  Past Medical History:  Diagnosis Date  . Hyperlipidemia   . Hypertension   . NAFLD (nonalcoholic fatty liver disease)   . Sleep apnea      Past Surgical History:  Procedure Laterality Date  . TONSILLECTOMY      Medications  Current Outpatient Medications on File Prior to Visit  Medication Sig Dispense Refill  . clonazePAM (KLONOPIN) 0.5 MG tablet Take 0.5 mg by mouth.    . gabapentin (NEURONTIN) 100 MG capsule Take 200 mg by mouth at bedtime.    . pantoprazole (PROTONIX) 40 MG tablet Take 40 mg by mouth 2 (two) times daily.    . rosuvastatin (CRESTOR) 20 MG tablet Take 1 tablet (20 mg total) by mouth daily. 90 tablet 3   Allergies No Known Allergies  Family History Family History  Problem Relation Age of Onset  . Stroke Mother   . Cancer Father        prostate cancer  . Hypertension Sister   . Cancer Brother        prostate cancer    Review of Systems: Constitutional:  no fevers Eye:  no recent significant change in vision Ears:  No changes in hearing Nose/Mouth/Throat:  no complaints of nasal congestion, no sore throat Cardiovascular: no chest pain Respiratory:  No shortness of breath Gastrointestinal:  No change in bowel habits GU:  No frequency Integumentary:  no abnormal skin lesions reported Neurologic:  no headaches Endocrine:  denies unexplained weight changes  Exam BP 136/84 (BP Location: Left Arm, Patient Position: Sitting, Cuff Size: Normal)   Pulse 82   Temp 99 F (37.2 C) (Oral)   Ht 5\' 6"  (1.676 m)   Wt 224 lb 2  oz (101.7 kg)   SpO2 97%   BMI 36.17 kg/m  General:  well developed, well nourished, in no apparent distress Skin:  no significant moles, warts, or growths Head:  no masses, lesions, or tenderness Eyes:  pupils equal and round, sclera anicteric without injection Ears:  canals without lesions, TMs shiny without retraction, no obvious effusion, no erythema Nose:  nares patent, septum midline, mucosa normal Throat/Pharynx:  lips and gingiva without lesion; tongue and uvula midline; non-inflamed pharynx; no exudates or postnasal drainage Lungs:  clear to auscultation, breath sounds equal bilaterally, no respiratory distress Cardio:  regular rate and rhythm, no LE edema or bruits Rectal: Deferred GI: BS+, S, NT, ND, no masses or organomegaly Musculoskeletal:  symmetrical muscle groups noted without atrophy or deformity Neuro:  gait normal; deep tendon reflexes normal and symmetric Psych: well oriented with normal range of affect and appropriate judgment/insight  Assessment and Plan  Well adult exam  Screening for AAA (abdominal aortic aneurysm) - Plan: [facility] [employer] SCREENING  Hyperlipidemia, unspecified hyperlipidemia type - Plan: Lipid panel, Comprehensive metabolic panel  Vitamin D deficiency - Plan: VITAMIN D 25 Hydroxy (Vit-D Deficiency, Fractures)   Well 70 y.o. male. Counseled on diet and exercise. Other orders as above. Rec'd flu shot and covid vaccine. He has refused both,  but was encouraged to reach out if he has questions or changes his mind.  Follow up in 6 mo.  The patient voiced understanding and agreement to the plan.  Jilda Roche [place], DO 02/06/20 2:39 PM

## 2020-02-06 NOTE — Patient Instructions (Addendum)
Give [place] 2-3 business days to get the results of your labs back.   Keep the diet clean and stay active.  Let me know if you change your mind about the flu shot.  I do recommend you get the COVID-19 vaccination, either Pfizer or Moderna. Please don't hesitate to reach out if you have any questions.   Let us know if you need anything.

## 2020-02-07 ENCOUNTER — Other Ambulatory Visit: Payer: Self-pay | Admitting: Family Medicine

## 2020-02-07 DIAGNOSIS — R748 Abnormal levels of other serum enzymes: Secondary | ICD-10-CM

## 2020-02-07 LAB — VITAMIN D 25 HYDROXY (VIT D DEFICIENCY, FRACTURES): VITD: 29.4 ng/mL — ABNORMAL LOW (ref 30.00–100.00)

## 2020-02-07 LAB — COMPREHENSIVE METABOLIC PANEL
ALT: 34 U/L (ref 0–53)
AST: 32 U/L (ref 0–37)
Albumin: 4.5 g/dL (ref 3.5–5.2)
Alkaline Phosphatase: 133 U/L — ABNORMAL HIGH (ref 39–117)
BUN: 12 mg/dL (ref 6–23)
CO2: 28 mEq/L (ref 19–32)
Calcium: 9.3 mg/dL (ref 8.4–10.5)
Chloride: 104 mEq/L (ref 96–112)
Creatinine, Ser: 0.79 mg/dL (ref 0.40–1.50)
GFR: 92.04 mL/min (ref >60.00)
Glucose, Bld: 91 mg/dL (ref 70–99)
Potassium: 3.8 mEq/L (ref 3.5–5.1)
Sodium: 139 mEq/L (ref 135–145)
Total Bilirubin: 0.9 mg/dL (ref 0.2–1.2)
Total Protein: 6.8 g/dL (ref 6.0–8.3)

## 2020-02-07 LAB — LIPID PANEL
Cholesterol: 124 mg/dL (ref 0–200)
HDL: 33.1 mg/dL — ABNORMAL LOW (ref >39.00)
LDL Cholesterol: 62 mg/dL (ref 0–99)
NonHDL: 91.06
Total CHOL/HDL Ratio: 4
Triglycerides: 147 mg/dL (ref 0.0–149.0)
VLDL: 29.4 mg/dL (ref 0.0–40.0)

## 2020-02-22 DIAGNOSIS — G4761 Periodic limb movement disorder: Secondary | ICD-10-CM | POA: Diagnosis not present

## 2020-03-11 ENCOUNTER — Other Ambulatory Visit: Payer: Self-pay

## 2020-03-11 ENCOUNTER — Encounter: Payer: Self-pay | Admitting: Family Medicine

## 2020-03-11 ENCOUNTER — Other Ambulatory Visit (INDEPENDENT_AMBULATORY_CARE_PROVIDER_SITE_OTHER): Payer: Medicare PPO

## 2020-03-11 ENCOUNTER — Ambulatory Visit (HOSPITAL_BASED_OUTPATIENT_CLINIC_OR_DEPARTMENT_OTHER)
Admission: RE | Admit: 2020-03-11 | Discharge: 2020-03-11 | Disposition: A | Discharge status: Home / Self Care | Payer: Medicare PPO | Source: Ambulatory Visit | Attending: Family Medicine | Admitting: Family Medicine

## 2020-03-11 DIAGNOSIS — Z87891 Personal history of nicotine dependence: Secondary | ICD-10-CM | POA: Diagnosis not present

## 2020-03-11 DIAGNOSIS — Z136 Encounter for screening for cardiovascular disorders: Secondary | ICD-10-CM | POA: Diagnosis not present

## 2020-03-11 DIAGNOSIS — R748 Abnormal levels of other serum enzymes: Secondary | ICD-10-CM

## 2020-03-11 DIAGNOSIS — I700 Atherosclerosis of aorta: Secondary | ICD-10-CM | POA: Insufficient documentation

## 2020-03-11 LAB — HEPATIC FUNCTION PANEL
ALT: 32 U/L (ref 0–53)
AST: 34 U/L (ref 0–37)
Albumin: 4.1 g/dL (ref 3.5–5.2)
Alkaline Phosphatase: 122 U/L — ABNORMAL HIGH (ref 39–117)
Bilirubin, Direct: 0.3 mg/dL (ref 0.0–0.3)
Total Bilirubin: 1 mg/dL (ref 0.2–1.2)
Total Protein: 6.2 g/dL (ref 6.0–8.3)

## 2020-03-11 LAB — GAMMA GT: GGT: 249 U/L — ABNORMAL HIGH (ref 7–51)

## 2020-03-14 LAB — ALKALINE PHOSPHATASE ISOENZYMES
Alkaline phosphatase (APISO): 128 U/L (ref 35–144)
Bone Isoenzymes: 28 % (ref 28–66)
Intestinal Isoenzymes: 0 % — ABNORMAL LOW (ref 1–24)
Liver Isoenzymes: 72 % — ABNORMAL HIGH (ref 25–69)
Macrohepatic isoenzymes: 0 % (ref <=0)
Placental isoenzymes: 0 % (ref <=0)

## 2020-04-22 ENCOUNTER — Other Ambulatory Visit: Payer: Self-pay | Admitting: Family Medicine

## 2020-05-27 ENCOUNTER — Other Ambulatory Visit: Payer: Self-pay | Admitting: Family Medicine

## 2020-07-17 DIAGNOSIS — R972 Elevated prostate specific antigen [PSA]: Secondary | ICD-10-CM | POA: Diagnosis not present

## 2020-07-24 DIAGNOSIS — N4281 Prostatodynia syndrome: Secondary | ICD-10-CM | POA: Diagnosis not present

## 2020-07-24 DIAGNOSIS — R972 Elevated prostate specific antigen [PSA]: Secondary | ICD-10-CM | POA: Diagnosis not present

## 2020-08-07 ENCOUNTER — Encounter: Payer: Self-pay | Admitting: Family Medicine

## 2020-08-07 ENCOUNTER — Ambulatory Visit: Payer: Medicare PPO | Admitting: Family Medicine

## 2020-08-07 ENCOUNTER — Other Ambulatory Visit: Payer: Self-pay

## 2020-08-07 VITALS — BP 152/70 | HR 90 | Temp 98.4°F | Ht 65.0 in | Wt 216.0 lb

## 2020-08-07 DIAGNOSIS — E785 Hyperlipidemia, unspecified: Secondary | ICD-10-CM | POA: Diagnosis not present

## 2020-08-07 DIAGNOSIS — I10 Essential (primary) hypertension: Secondary | ICD-10-CM

## 2020-08-07 DIAGNOSIS — I700 Atherosclerosis of aorta: Secondary | ICD-10-CM

## 2020-08-07 MED ORDER — AMLODIPINE BESYLATE 5 MG PO TABS
5.0000 mg | ORAL_TABLET | Freq: Every day | ORAL | 3 refills | Status: DC
Start: 2020-08-07 — End: 2020-09-26

## 2020-08-07 MED ORDER — ASPIRIN 81 MG PO TBEC: 81.0000 mg | DELAYED_RELEASE_TABLET | Freq: Every day | ORAL | 12 refills | Status: DC

## 2020-08-07 NOTE — Patient Instructions (Addendum)
Keep the diet clean and stay active.  Check your blood pressures 2-3 times per week, alternating the time of day you check it. If it is high, considering waiting 1-2 minutes and rechecking. If it gets higher, your anxiety is likely creeping up and we should avoid rechecking.   The new Shingrix vaccine (for shingles) is a 2 shot series. It can make people feel low energy, achy and almost like they have the flu for 48 hours after injection. Please plan accordingly when deciding on when to get this shot. Call your pharmacy for an appointment to get this. The second shot of the series is less severe regarding the side effects, but it still lasts 48 hours.   Let us know if you need anything.  EXERCISES  RANGE OF MOTION (ROM) AND STRETCHING EXERCISES - Low Back Pain Most people with lower back pain will find that their symptoms get worse with excessive bending forward (flexion) or arching at the lower back (extension). The exercises that will help resolve your symptoms will focus on the opposite motion.  If you have pain, numbness or tingling which travels down into your buttocks, leg or foot, the goal of the therapy is for these symptoms to move closer to your back and eventually resolve. Sometimes, these leg symptoms will get better, but your lower back pain may worsen. This is often an indication of progress in your rehabilitation. Be very alert to any changes in your symptoms and the activities in which you participated in the 24 hours prior to the change. Sharing this information with your caregiver will allow him or her to most efficiently treat your condition. These exercises may help you when beginning to rehabilitate your injury. Your symptoms may resolve with or without further involvement from your physician, physical therapist or athletic trainer. While completing these exercises, remember:  Restoring tissue flexibility helps normal motion to return to the joints. This allows healthier, less  painful movement and activity. An effective stretch should be held for at least 30 seconds. A stretch should never be painful. You should only feel a gentle lengthening or release in the stretched tissue. FLEXION RANGE OF MOTION AND STRETCHING EXERCISES:  STRETCH - Flexion, Single Knee to Chest  Lie on a firm bed or floor with both legs extended in front of you. Keeping one leg in contact with the floor, bring your opposite knee to your chest. Hold your leg in place by either grabbing behind your thigh or at your knee. Pull until you feel a gentle stretch in your low back. Hold 30 seconds. Slowly release your grasp and repeat the exercise with the opposite side. Repeat 2 times. Complete this exercise 3 times per week.   STRETCH - Flexion, Double Knee to Chest Lie on a firm bed or floor with both legs extended in front of you. Keeping one leg in contact with the floor, bring your opposite knee to your chest. Tense your stomach muscles to support your back and then lift your other knee to your chest. Hold your legs in place by either grabbing behind your thighs or at your knees. Pull both knees toward your chest until you feel a gentle stretch in your low back. Hold 30 seconds. Tense your stomach muscles and slowly return one leg at a time to the floor. Repeat 2 times. Complete this exercise 3 times per week.   STRETCH - Low Trunk Rotation Lie on a firm bed or floor. Keeping your legs in front of you,  bend your knees so they are both pointed toward the ceiling and your feet are flat on the floor. Extend your arms out to the side. This will stabilize your upper body by keeping your shoulders in contact with the floor. Gently and slowly drop both knees together to one side until you feel a gentle stretch in your low back. Hold for 30 seconds. Tense your stomach muscles to support your lower back as you bring your knees back to the starting position. Repeat the exercise to the other side. Repeat  2 times. Complete this exercise at least 3 times per week.   EXTENSION RANGE OF MOTION AND FLEXIBILITY EXERCISES:  STRETCH - Extension, Prone on Elbows  Lie on your stomach on the floor, a bed will be too soft. Place your palms about shoulder width apart and at the height of your head. Place your elbows under your shoulders. If this is too painful, stack pillows under your chest. Allow your body to relax so that your hips drop lower and make contact more completely with the floor. Hold this position for 30 seconds. Slowly return to lying flat on the floor. Repeat 2 times. Complete this exercise 3 times per week.   RANGE OF MOTION - Extension, Prone Press Ups Lie on your stomach on the floor, a bed will be too soft. Place your palms about shoulder width apart and at the height of your head. Keeping your back as relaxed as possible, slowly straighten your elbows while keeping your hips on the floor. You may adjust the placement of your hands to maximize your comfort. As you gain motion, your hands will come more underneath your shoulders. Hold this position 30 seconds. Slowly return to lying flat on the floor. Repeat 2 times. Complete this exercise 3 times per week.   RANGE OF MOTION- Quadruped, Neutral Spine  Assume a hands and knees position on a firm surface. Keep your hands under your shoulders and your knees under your hips. You may place padding under your knees for comfort. Drop your head and point your tailbone toward the ground below you. This will round out your lower back like an angry cat. Hold this position for 30 seconds. Slowly lift your head and release your tail bone so that your back sags into a large arch, like an old horse. Hold this position for 30 seconds. Repeat this until you feel limber in your low back. Now, find your "sweet spot." This will be the most comfortable position somewhere between the two previous positions. This is your neutral spine. Once you have found  this position, tense your stomach muscles to support your low back. Hold this position for 30 seconds. Repeat 2 times. Complete this exercise 3 times per week.   STRENGTHENING EXERCISES - Low Back Sprain These exercises may help you when beginning to rehabilitate your injury. These exercises should be done near your "sweet spot." This is the neutral, low-back arch, somewhere between fully rounded and fully arched, that is your least painful position. When performed in this safe range of motion, these exercises can be used for people who have either a flexion or extension based injury. These exercises may resolve your symptoms with or without further involvement from your physician, physical therapist or athletic trainer. While completing these exercises, remember:  Muscles can gain both the endurance and the strength needed for everyday activities through controlled exercises. Complete these exercises as instructed by your physician, physical therapist or athletic trainer. Increase the resistance and  repetitions only as guided. You may experience muscle soreness or fatigue, but the pain or discomfort you are trying to eliminate should never worsen during these exercises. If this pain does worsen, stop and make certain you are following the directions exactly. If the pain is still present after adjustments, discontinue the exercise until you can discuss the trouble with your caregiver.  STRENGTHENING - Deep Abdominals, Pelvic Tilt  Lie on a firm bed or floor. Keeping your legs in front of you, bend your knees so they are both pointed toward the ceiling and your feet are flat on the floor. Tense your lower abdominal muscles to press your low back into the floor. This motion will rotate your pelvis so that your tail bone is scooping upwards rather than pointing at your feet or into the floor. With a gentle tension and even breathing, hold this position for 3 seconds. Repeat 2 times. Complete this exercise  3 times per week.   STRENGTHENING - Abdominals, Crunches  Lie on a firm bed or floor. Keeping your legs in front of you, bend your knees so they are both pointed toward the ceiling and your feet are flat on the floor. Cross your arms over your chest. Slightly tip your chin down without bending your neck. Tense your abdominals and slowly lift your trunk high enough to just clear your shoulder blades. Lifting higher can put excessive stress on the lower back and does not further strengthen your abdominal muscles. Control your return to the starting position. Repeat 2 times. Complete this exercise 3 times per week.   STRENGTHENING - Quadruped, Opposite UE/LE Lift  Assume a hands and knees position on a firm surface. Keep your hands under your shoulders and your knees under your hips. You may place padding under your knees for comfort. Find your neutral spine and gently tense your abdominal muscles so that you can maintain this position. Your shoulders and hips should form a rectangle that is parallel with the floor and is not twisted. Keeping your trunk steady, lift your right hand no higher than your shoulder and then your left leg no higher than your hip. Make sure you are not holding your breath. Hold this position for 30 seconds. Continuing to keep your abdominal muscles tense and your back steady, slowly return to your starting position. Repeat with the opposite arm and leg. Repeat 2 times. Complete this exercise 3 times per week.   STRENGTHENING - Abdominals and Quadriceps, Straight Leg Raise  Lie on a firm bed or floor with both legs extended in front of you. Keeping one leg in contact with the floor, bend the other knee so that your foot can rest flat on the floor. Find your neutral spine, and tense your abdominal muscles to maintain your spinal position throughout the exercise. Slowly lift your straight leg off the floor about 6 inches for a count of 3, making sure to not hold your  breath. Still keeping your neutral spine, slowly lower your leg all the way to the floor. Repeat this exercise with each leg 2 times. Complete this exercise 3 times per week.  POSTURE AND BODY MECHANICS CONSIDERATIONS - Low Back Sprain Keeping correct posture when sitting, standing or completing your activities will reduce the stress put on different body tissues, allowing injured tissues a chance to heal and limiting painful experiences. The following are general guidelines for improved posture.  While reading these guidelines, remember: The exercises prescribed by your provider will help you have the  flexibility and strength to maintain correct postures. The correct posture provides the best environment for your joints to work. All of your joints have less wear and tear when properly supported by a spine with good posture. This means you will experience a healthier, less painful body. Correct posture must be practiced with all of your activities, especially prolonged sitting and standing. Correct posture is as important when doing repetitive low-stress activities (typing) as it is when doing a single heavy-load activity (lifting).  RESTING POSITIONS Consider which positions are most painful for you when choosing a resting position. If you have pain with flexion-based activities (sitting, bending, stooping, squatting), choose a position that allows you to rest in a less flexed posture. You would want to avoid curling into a fetal position on your side. If your pain worsens with extension-based activities (prolonged standing, working overhead), avoid resting in an extended position such as sleeping on your stomach. Most people will find more comfort when they rest with their spine in a more neutral position, neither too rounded nor too arched. Lying on a non-sagging bed on your side with a pillow between your knees, or on your back with a pillow under your knees will often provide some relief. Keep in  mind, being in any one position for a prolonged period of time, no matter how correct your posture, can still lead to stiffness.  PROPER SITTING POSTURE In order to minimize stress and discomfort on your spine, you must sit with correct posture. Sitting with good posture should be effortless for a healthy body. Returning to good posture is a gradual process. Many people can work toward this most comfortably by using various supports until they have the flexibility and strength to maintain this posture on their own. When sitting with proper posture, your ears will fall over your shoulders and your shoulders will fall over your hips. You should use the back of the chair to support your upper back. Your lower back will be in a neutral position, just slightly arched. You may place a small pillow or folded towel at the base of your lower back for  support.  When working at a desk, create an environment that supports good, upright posture. Without extra support, muscles tire, which leads to excessive strain on joints and other tissues. Keep these recommendations in mind:  CHAIR: A chair should be able to slide under your desk when your back makes contact with the back of the chair. This allows you to work closely. The chair's height should allow your eyes to be level with the upper part of your monitor and your hands to be slightly lower than your elbows.  BODY POSITION Your feet should make contact with the floor. If this is not possible, use a foot rest. Keep your ears over your shoulders. This will reduce stress on your neck and low back.  INCORRECT SITTING POSTURES  If you are feeling tired and unable to assume a healthy sitting posture, do not slouch or slump. This puts excessive strain on your back tissues, causing more damage and pain. Healthier options include: Using more support, like a lumbar pillow. Switching tasks to something that requires you to be upright or walking. Talking a brief  walk. Lying down to rest in a neutral-spine position.  PROLONGED STANDING WHILE SLIGHTLY LEANING FORWARD  When completing a task that requires you to lean forward while standing in one place for a long time, place either foot up on a stationary 2-4 inch high  object to help maintain the best posture. When both feet are on the ground, the lower back tends to lose its slight inward curve. If this curve flattens (or becomes too large), then the back and your other joints will experience too much stress, tire more quickly, and can cause pain.  CORRECT STANDING POSTURES Proper standing posture should be assumed with all daily activities, even if they only take a few moments, like when brushing your teeth. As in sitting, your ears should fall over your shoulders and your shoulders should fall over your hips. You should keep a slight tension in your abdominal muscles to brace your spine. Your tailbone should point down to the ground, not behind your body, resulting in an over-extended swayback posture.   INCORRECT STANDING POSTURES  Common incorrect standing postures include a forward head, locked knees and/or an excessive swayback. WALKING Walk with an upright posture. Your ears, shoulders and hips should all line-up.  PROLONGED ACTIVITY IN A FLEXED POSITION When completing a task that requires you to bend forward at your waist or lean over a low surface, try to find a way to stabilize 3 out of 4 of your limbs. You can place a hand or elbow on your thigh or rest a knee on the surface you are reaching across. This will provide you more stability, so that your muscles do not tire as quickly. By keeping your knees relaxed, or slightly bent, you will also reduce stress across your lower back. CORRECT LIFTING TECHNIQUES  DO : Assume a wide stance. This will provide you more stability and the opportunity to get as close as possible to the object which you are lifting. Tense your abdominals to brace your  spine. Bend at the knees and hips. Keeping your back locked in a neutral-spine position, lift using your leg muscles. Lift with your legs, keeping your back straight. Test the weight of unknown objects before attempting to lift them. Try to keep your elbows locked down at your sides in order get the best strength from your shoulders when carrying an object.   Always ask for help when lifting heavy or awkward objects. INCORRECT LIFTING TECHNIQUES DO NOT:  Lock your knees when lifting, even if it is a small object. Bend and twist. Pivot at your feet or move your feet when needing to change directions. Assume that you can safely pick up even a paperclip without proper posture.

## 2020-08-07 NOTE — Progress Notes (Signed)
Chief Complaint  Patient presents with   Follow-up    Subjective Cameron Woods is a 70 y.o. male who presents for hypertension follow up. He does monitor home blood pressures. Blood pressures ranging from 140-150's/80's on average. He is not currently taking any meds  He is usually adhering to a healthy diet overall. Current exercise: tearing down a house right now No Cp or SOB.   Hyperlipidemia Patient presents for hyperlipidemia follow up. Currently being treated with Crestor 20 mg/d and compliance with treatment thus far has been good. He denies myalgias. The patient is known to have coexisting coronary artery disease.   Past Medical History:  Diagnosis Date   Aortic atherosclerosis (HCC)    Hyperlipidemia    Hypertension    NAFLD (nonalcoholic fatty liver disease)    Sleep apnea     Exam BP (!) 152/70   Pulse 90   Temp 98.4 F (36.9 C) (Oral)   Ht 5\' 5"  (1.651 m)   Wt 216 lb (98 kg)   SpO2 97%   BMI 35.94 kg/m  General:  well developed, well nourished, in no apparent distress Heart: RRR, no bruits, no LE edema Lungs: clear to auscultation, no accessory muscle use Psych: well oriented with normal range of affect and appropriate judgment/insight  Essential hypertension  Aortic atherosclerosis (HCC)  Hyperlipidemia, unspecified hyperlipidemia type  Chronic, unstable. Start Norvasc 5 mg/d. Monitor BP at home. Counseled on diet and exercise. 2/3. Cont Crestor 20 mg/d, ASA 81 mg/d. F/u in 1 mo. The patient voiced understanding and agreement to the plan.  [place], DO 08/07/20  2:30 PM

## 2020-08-20 DIAGNOSIS — G4733 Obstructive sleep apnea (adult) (pediatric): Secondary | ICD-10-CM | POA: Diagnosis not present

## 2020-08-20 DIAGNOSIS — G4761 Periodic limb movement disorder: Secondary | ICD-10-CM | POA: Diagnosis not present

## 2020-09-10 ENCOUNTER — Encounter: Payer: Self-pay | Admitting: Family Medicine

## 2020-09-10 ENCOUNTER — Other Ambulatory Visit: Payer: Self-pay

## 2020-09-10 ENCOUNTER — Ambulatory Visit: Payer: Medicare PPO | Admitting: Family Medicine

## 2020-09-10 ENCOUNTER — Other Ambulatory Visit: Payer: Self-pay | Admitting: Family Medicine

## 2020-09-10 VITALS — BP 132/68 | HR 75 | Temp 97.5°F | Ht 65.0 in | Wt 216.2 lb

## 2020-09-10 DIAGNOSIS — K760 Fatty (change of) liver, not elsewhere classified: Secondary | ICD-10-CM

## 2020-09-10 DIAGNOSIS — E785 Hyperlipidemia, unspecified: Secondary | ICD-10-CM

## 2020-09-10 DIAGNOSIS — R748 Abnormal levels of other serum enzymes: Secondary | ICD-10-CM

## 2020-09-10 DIAGNOSIS — I10 Essential (primary) hypertension: Secondary | ICD-10-CM

## 2020-09-10 LAB — COMPREHENSIVE METABOLIC PANEL
ALT: 50 U/L (ref 0–53)
AST: 55 U/L — ABNORMAL HIGH (ref 0–37)
Albumin: 4.1 g/dL (ref 3.5–5.2)
Alkaline Phosphatase: 242 U/L — ABNORMAL HIGH (ref 39–117)
BUN: 7 mg/dL (ref 6–23)
CO2: 27 mEq/L (ref 19–32)
Calcium: 9 mg/dL (ref 8.4–10.5)
Chloride: 103 mEq/L (ref 96–112)
Creatinine, Ser: 0.71 mg/dL (ref 0.40–1.50)
GFR: 94.66 mL/min (ref >60.00)
Glucose, Bld: 103 mg/dL — ABNORMAL HIGH (ref 70–99)
Potassium: 4 mEq/L (ref 3.5–5.1)
Sodium: 138 mEq/L (ref 135–145)
Total Bilirubin: 1 mg/dL (ref 0.2–1.2)
Total Protein: 6.6 g/dL (ref 6.0–8.3)

## 2020-09-10 LAB — LIPID PANEL
Cholesterol: 161 mg/dL (ref 0–200)
HDL: 62.8 mg/dL (ref >39.00)
LDL Cholesterol: 79 mg/dL (ref 0–99)
NonHDL: 98.07
Total CHOL/HDL Ratio: 3
Triglycerides: 93 mg/dL (ref 0.0–149.0)
VLDL: 18.6 mg/dL (ref 0.0–40.0)

## 2020-09-10 NOTE — Patient Instructions (Addendum)
Continue checking your blood pressure at home. Send me a message in 2 weeks with your readings. We will make a further decision based on those.   Continue the Norvasc for now.  When you check your BP, make sure you have been doing something calm/relaxing 5 minutes prior to checking. Both feet should be flat on the floor and you should be sitting. Use your left arm and make sure it is in a relaxed position (on a table), and that the cuff is at the approximate level/height of your heart.  Keep the diet clean and stay active.  Give [place] 2-3 business days to get the results of your labs back.   Let us know if you need anything.

## 2020-09-10 NOTE — Progress Notes (Signed)
Chief Complaint  Patient presents with   Follow-up    Blood pressure medication follow-up. BP still elevated.    Subjective Cameron Woods is a 70 y.o. male who presents for hypertension follow up. He does monitor home blood pressures. Blood pressures ranging from 130-150's/70's on average. He is compliant with medication- Norvasc 5 mg/d. Patient has these side effects of medication: none He is usually adhering to a healthy diet overall. 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    Past Medical History:  Diagnosis Date   Aortic atherosclerosis (HCC)    Hyperlipidemia    Hypertension    NAFLD (nonalcoholic fatty liver disease)    Sleep apnea     Exam BP 132/68   Pulse 75   Temp (!) 97.5 F (36.4 C) (Oral)   Ht 5\' 5"  (1.651 m)   Wt 216 lb 4 oz (98.1 kg)   SpO2 98%   BMI 35.99 kg/m  General:  well developed, well nourished, in no apparent distress Heart: RRR, no bruits, no LE edema Lungs: clear to auscultation, no accessory muscle use Psych: well oriented with normal range of affect and appropriate judgment/insight  Essential hypertension  Hyperlipidemia, unspecified hyperlipidemia type - Plan: Lipid panel, Comprehensive metabolic panel  Chronic, unsure if controlled. Cont Norvasc 5 mg/d. Will monitor at home, counseled on correct way to monitor BP at home. Arm was too low before. Send message in 2 weeks and then will make decision based on that. Counseled on diet and exercise. Ck labs.  F/u in 6 mo for CPE or prn. The patient voiced understanding and agreement to the plan.  [place], DO 09/10/20  9:15 AM

## 2020-09-10 NOTE — Progress Notes (Signed)
lab

## 2020-09-26 ENCOUNTER — Other Ambulatory Visit: Payer: Self-pay | Admitting: Family Medicine

## 2020-09-26 MED ORDER — AMLODIPINE BESYLATE 10 MG PO TABS: 10.0000 mg | ORAL_TABLET | Freq: Every day | ORAL | 3 refills | Status: DC

## 2020-09-27 ENCOUNTER — Other Ambulatory Visit: Payer: Self-pay | Admitting: Family Medicine

## 2020-10-08 ENCOUNTER — Other Ambulatory Visit: Payer: Medicare PPO

## 2020-10-08 ENCOUNTER — Ambulatory Visit (INDEPENDENT_AMBULATORY_CARE_PROVIDER_SITE_OTHER): Payer: Medicare PPO

## 2020-10-08 VITALS — Ht 65.0 in | Wt 216.0 lb

## 2020-10-08 DIAGNOSIS — Z0000 Encounter for general adult medical examination without abnormal findings: Secondary | ICD-10-CM

## 2020-10-08 NOTE — Progress Notes (Signed)
Subjective:   Cameron Woods is a 70 y.o. male who presents for an Initial Medicare Annual Wellness Visit.  I connected with Cameron Woods today by telephone and verified that I am speaking with the correct person using two identifiers. Location patient: home Location provider: work Persons participating in the virtual visit: patient, Engineer, civil (consulting).    I discussed the limitations, risks, security and privacy concerns of performing an evaluation and management service by telephone and the availability of in person appointments. I also discussed with the patient that there may be a patient responsible charge related to this service. The patient expressed understanding and verbally consented to this telephonic visit.    Interactive audio and video telecommunications were attempted between this provider and patient, however failed, due to patient having technical difficulties OR patient did not have access to video capability.  We continued and completed visit with audio only.  Some vital signs may be absent or patient reported.   Time Spent with patient on telephone encounter: 20 minutes   Review of Systems     Cardiac Risk Factors include: advanced age (>17men, >9 women);male gender;dyslipidemia;hypertension;obesity (BMI >30kg/m2)     Objective:    Today's Vitals   10/08/20 1339  Weight: 216 lb (98 kg)  Height: 5\' 5"  (1.651 m)   Body mass index is 35.94 kg/m.  Advanced Directives 10/08/2020  Does Patient Have a Medical Advance Directive? Yes  Type of Advance Directive Living will    Current Medications (verified) Outpatient Encounter Medications as of 10/08/2020  Medication Sig   amLODipine (NORVASC) 10 MG tablet Take 1 tablet (10 mg total) by mouth daily.   aspirin 81 MG EC tablet Take 1 tablet (81 mg total) by mouth daily. Swallow whole.   clonazePAM (KLONOPIN) 0.5 MG tablet Take 0.5 mg by mouth.   gabapentin (NEURONTIN) 100 MG capsule Take 200 mg by mouth at bedtime.   pantoprazole  (PROTONIX) 40 MG tablet TAKE 1 TABLET BY MOUTH 2 TIMES A DAY FORACID   rosuvastatin (CRESTOR) 20 MG tablet Take 1 tablet (20 mg total) by mouth daily.   No facility-administered encounter medications on file as of 10/08/2020.    Allergies (verified) Patient has no known allergies.   History: Past Medical History:  Diagnosis Date   Aortic atherosclerosis (HCC)    Hyperlipidemia    Hypertension    NAFLD (nonalcoholic fatty liver disease)    Sleep apnea    Past Surgical History:  Procedure Laterality Date   TONSILLECTOMY     Family History  Problem Relation Age of Onset   Stroke Mother    Cancer Father        prostate cancer   Hypertension Sister    Cancer Brother        prostate cancer   Social History   Socioeconomic History   Marital status: Divorced    Spouse name: Not on file   Number of children: Not on file   Years of education: Not on file   Highest education level: Not on file  Occupational History   Not on file  Tobacco Use   Smoking status: Former   Smokeless tobacco: Never  Substance and Sexual Activity   Alcohol use: Yes    Comment: occasional   Drug use: No   Sexual activity: Not on file  Other Topics Concern   Not on file  Social History Narrative   Not on file   Social Determinants of Health   Financial Resource Strain: Low Risk  Difficulty of Paying Living Expenses: Not hard at all  Food Insecurity: No Food Insecurity   Worried About Running Out of Food in the Last Year: Never true   Ran Out of Food in the Last Year: Never true  Transportation Needs: No Transportation Needs   Lack of Transportation (Medical): No   Lack of Transportation (Non-Medical): No  Physical Activity: Inactive   Days of Exercise per Week: 0 days   Minutes of Exercise per Session: 0 min  Stress: No Stress Concern Present   Feeling of Stress : Not at all  Social Connections: Socially Isolated   Frequency of Communication with Friends and Family: More than three  times a week   Frequency of Social Gatherings with Friends and Family: More than three times a week   Attends Religious Services: Never   Database administratorActive Member of Clubs or Organizations: No   Attends Engineer, structuralClub or Organization Meetings: Never   Marital Status: Divorced    Tobacco Counseling Counseling given: Not Answered   Clinical Intake:  Pre-visit preparation completed: Yes  Pain : No/denies pain     BMI - recorded: 35.94 Nutritional Status: BMI > 30  Obese Nutritional Risks: None Diabetes: No  How often do you need to have someone help you when you read instructions, pamphlets, or other written materials from your doctor or pharmacy?: 1 - Never  Diabetic?No  Interpreter Needed?: No  Information entered by :: Thomasenia SalesMartha Eugenie Harewood LPN   Activities of Daily Living In your present state of health, do you have any difficulty performing the following activities: 10/08/2020 02/06/2020  Hearing? Y N  Vision? N N  Difficulty concentrating or making decisions? N N  Walking or climbing stairs? N N  Dressing or bathing? N N  Doing errands, shopping? N N  Preparing Food and eating ? N -  Using the Toilet? N -  In the past six months, have you accidently leaked urine? N -  Do you have problems with loss of bowel control? N -  Managing your Medications? N -  Managing your Finances? N -  Housekeeping or managing your Housekeeping? N -  Some recent data might be hidden    Patient Care Team: Sharlene DoryWendling, Nicholas Paul, DO as PCP - General (Family Medicine)  Indicate any recent Medical Services you may have received from other than [facility] providers in the past year (date may be approximate).     Assessment:   This is a routine wellness examination for Cameron Woods.  Hearing/Vision screen Hearing Screening - Comments:: C/o hearing loss in both ears-right ear is worse Vision Screening - Comments:: Last eye exam-4-5 months ago  Dietary issues and exercise activities discussed: Current Exercise Habits:  The patient does not participate in regular exercise at present, Exercise limited by: None identified   Goals Addressed             This Visit's Progress    Patient Stated       Eat healthier & increase activity       Depression Screen PHQ 2/9 Scores 10/08/2020 09/10/2020 02/06/2020  PHQ - 2 Score 0 0 0    Fall Risk Fall Risk  10/08/2020 09/10/2020  Falls in the past year? 0 0  Number falls in past yr: 0 0  Injury with Fall? 0 0  Risk for fall due to : - No Fall Risks  Follow up Falls prevention discussed Falls evaluation completed    FALL RISK PREVENTION PERTAINING TO THE HOME:  Any stairs in  or around the home? No  Home free of loose throw rugs in walkways, pet beds, electrical cords, etc? Yes  Adequate lighting in your home to reduce risk of falls? Yes   ASSISTIVE DEVICES UTILIZED TO PREVENT FALLS:  Life alert? No  Use of a cane, walker or w/c? No  Grab bars in the bathroom? Yes  Shower chair or bench in shower? No  Elevated toilet seat or a handicapped toilet? No   TIMED UP AND GO:  Was the test performed? No . Phone visit   Cognitive Function:Normal cognitive status assessed by this Nurse Health Advisor. No abnormalities found.          Immunizations Immunization History  Administered Date(s) Administered   Pneumococcal Polysaccharide-23 11/09/2018   Tdap 11/09/2018    TDAP status: Up to date  Flu Vaccine status: Due, Education has been provided regarding the importance of this vaccine. Advised may receive this vaccine at local pharmacy or Health Dept. Aware to provide a copy of the vaccination record if obtained from local pharmacy or Health Dept. Verbalized acceptance and understanding.  Pneumococcal vaccine status: Due, Education has been provided regarding the importance of this vaccine. Advised may receive this vaccine at local pharmacy or Health Dept. Aware to provide a copy of the vaccination record if obtained from local pharmacy or Health Dept.  Verbalized acceptance and understanding.  Covid-19 vaccine status: Declined, Education has been provided regarding the importance of this vaccine but patient still declined. Advised may receive this vaccine at local pharmacy or Health Dept.or vaccine clinic. Aware to provide a copy of the vaccination record if obtained from local pharmacy or Health Dept. Verbalized acceptance and understanding.  Qualifies for Shingles Vaccine? Yes   Zostavax completed No   Shingrix Completed?: No.    Education has been provided regarding the importance of this vaccine. Patient has been advised to call insurance company to determine out of pocket expense if they have not yet received this vaccine. Advised may also receive vaccine at local pharmacy or Health Dept. Verbalized acceptance and understanding.  Screening Tests Health Maintenance  Topic Date Due   COVID-19 Vaccine (1) Never done   COLONOSCOPY (Pts 45-30yrs Insurance coverage will need to be confirmed)  Never done   Zoster Vaccines- Shingrix (1 of 2) Never done   INFLUENZA VACCINE  Never done   TETANUS/TDAP  11/08/2028   Hepatitis C Screening  Completed   HPV VACCINES  Aged Out   PNA vac Low Risk Adult  Discontinued    Health Maintenance  Health Maintenance Due  Topic Date Due   COVID-19 Vaccine (1) Never done   COLONOSCOPY (Pts 45-61yrs Insurance coverage will need to be confirmed)  Never done   Zoster Vaccines- Shingrix (1 of 2) Never done   INFLUENZA VACCINE  Never done    Colorectal cancer screening: Due-Declined today.  Lung Cancer Screening: (Low Dose CT Chest recommended if Age 16-80 years, 30 pack-year currently smoking OR have quit w/in 15years.) does not qualify.     Additional Screening:  Hepatitis C Screening: Completed 07/19/2019  Vision Screening: Recommended annual ophthalmology exams for early detection of glaucoma and other disorders of the eye. Is the patient up to date with their annual eye exam?  Yes  Who is the  provider or what is the name of the office in which the patient attends annual eye exams? Pt unsure of name   Dental Screening: Recommended annual dental exams for proper oral hygiene  Community Resource Referral /  Chronic Care Management: CRR required this visit?  No   CCM required this visit?  No      Plan:     I have personally reviewed and noted the following in the patient's chart:   Medical and social history Use of alcohol, tobacco or illicit drugs  Current medications and supplements including opioid prescriptions. Patient is not currently taking opioid prescriptions. Functional ability and status Nutritional status Physical activity Advanced directives List of other physicians Hospitalizations, surgeries, and ER visits in previous 12 months Vitals Screenings to include cognitive, depression, and falls Referrals and appointments  In addition, I have reviewed and discussed with patient certain preventive protocols, quality metrics, and best practice recommendations. A written personalized care plan for preventive services as well as general preventive health recommendations were provided to patient.   Due to this being a telephonic visit, the after visit summary with patients personalized plan was offered to patient via mail or my-chart. Patient would like to access on my-chart.   Roanna Raider, LPN   02/08/6158  Nurse Health Advisor  Nurse Notes: None

## 2020-10-08 NOTE — Patient Instructions (Signed)
Cameron Woods , Thank you for taking time to complete your Medicare Wellness Visit. I appreciate your ongoing commitment to your health goals. Please review the following plan we discussed and let me know if I can assist you in the future.   Screening recommendations/referrals: Colonoscopy: Due-Declined today. Please call when you are ready to schedule. Recommended yearly ophthalmology/optometry visit for glaucoma screening and checkup Recommended yearly dental visit for hygiene and checkup  Vaccinations: Influenza vaccine: Due-May obtain vaccine at our office or your local pharmacy. Pneumococcal vaccine: Due--May obtain vaccine at our office or your local pharmacy. Tdap vaccine: Up to date-Due-11/08/2028 Shingles vaccine: Discuss with pharmacy   Covid-19: Declined  Advanced directives: please bring a copy for your chart  Conditions/risks identified: See problem list  Next appointment: Follow up in one year for your annual wellness visit. 10/13/2021 @ 1:40  Preventive Care 65 Years and Older, Male Preventive care refers to lifestyle choices and visits with your health care provider that can promote health and wellness. What does preventive care include? A yearly physical exam. This is also called an annual well check. Dental exams once or twice a year. Routine eye exams. Ask your health care provider how often you should have your eyes checked. Personal lifestyle choices, including: Daily care of your teeth and gums. Regular physical activity. Eating a healthy diet. Avoiding tobacco and drug use. Limiting alcohol use. Practicing safe sex. Taking low doses of aspirin every day. Taking vitamin and mineral supplements as recommended by your health care provider. What happens during an annual well check? The services and screenings done by your health care provider during your annual well check will depend on your age, overall health, lifestyle risk factors, and family history of  disease. Counseling  Your health care provider may ask you questions about your: Alcohol use. Tobacco use. Drug use. Emotional well-being. Home and relationship well-being. Sexual activity. Eating habits. History of falls. Memory and ability to understand (cognition). Work and work Astronomer. Screening  You may have the following tests or measurements: Height, weight, and BMI. Blood pressure. Lipid and cholesterol levels. These may be checked every 5 years, or more frequently if you are over 38 years old. Skin check. Lung cancer screening. You may have this screening every year starting at age 18 if you have a 30-pack-year history of smoking and currently smoke or have quit within the past 15 years. Fecal occult blood test (FOBT) of the stool. You may have this test every year starting at age 55. Flexible sigmoidoscopy or colonoscopy. You may have a sigmoidoscopy every 5 years or a colonoscopy every 10 years starting at age 43. Prostate cancer screening. Recommendations will vary depending on your family history and other risks. Hepatitis C blood test. Hepatitis B blood test. Sexually transmitted disease (STD) testing. Diabetes screening. This is done by checking your blood sugar (glucose) after you have not eaten for a while (fasting). You may have this done every 1-3 years. Abdominal aortic aneurysm (AAA) screening. You may need this if you are a current or former smoker. Osteoporosis. You may be screened starting at age 76 if you are at high risk. Talk with your health care provider about your test results, treatment options, and if necessary, the need for more tests. Vaccines  Your health care provider may recommend certain vaccines, such as: Influenza vaccine. This is recommended every year. Tetanus, diphtheria, and acellular pertussis (Tdap, Td) vaccine. You may need a Td booster every 10 years. Zoster vaccine. You may  need this after age 2. Pneumococcal 13-valent  conjugate (PCV13) vaccine. One dose is recommended after age 48. Pneumococcal polysaccharide (PPSV23) vaccine. One dose is recommended after age 55. Talk to your health care provider about which screenings and vaccines you need and how often you need them. This information is not intended to replace advice given to you by your health care provider. Make sure you discuss any questions you have with your health care provider. Document Released: 02/15/2015 Document Revised: 10/09/2015 Document Reviewed: 11/20/2014 Elsevier Interactive Patient Education  2017 [place] Prevention in the Home Falls can cause injuries. They can happen to people of all ages. There are many things you can do to make your home safe and to help prevent falls. What can I do on the outside of my home? Regularly fix the edges of walkways and driveways and fix any cracks. Remove anything that might make you trip as you walk through a door, such as a raised step or threshold. Trim any bushes or trees on the path to your home. Use bright outdoor lighting. Clear any walking paths of anything that might make someone trip, such as rocks or tools. Regularly check to see if handrails are loose or broken. Make sure that both sides of any steps have handrails. Any raised decks and porches should have guardrails on the edges. Have any leaves, snow, or ice cleared regularly. Use sand or salt on walking paths during winter. Clean up any spills in your garage right away. This includes oil or grease spills. What can I do in the bathroom? Use night lights. Install grab bars by the toilet and in the tub and shower. Do not use towel bars as grab bars. Use non-skid mats or decals in the tub or shower. If you need to sit down in the shower, use a plastic, non-slip stool. Keep the floor dry. Clean up any water that spills on the floor as soon as it happens. Remove soap buildup in the tub or shower regularly. Attach bath mats  securely with double-sided non-slip rug tape. Do not have throw rugs and other things on the floor that can make you trip. What can I do in the bedroom? Use night lights. Make sure that you have a light by your bed that is easy to reach. Do not use any sheets or blankets that are too big for your bed. They should not hang down onto the floor. Have a firm chair that has side arms. You can use this for support while you get dressed. Do not have throw rugs and other things on the floor that can make you trip. What can I do in the kitchen? Clean up any spills right away. Avoid walking on wet floors. Keep items that you use a lot in easy-to-reach places. If you need to reach something above you, use a strong step stool that has a grab bar. Keep electrical cords out of the way. Do not use floor polish or wax that makes floors slippery. If you must use wax, use non-skid floor wax. Do not have throw rugs and other things on the floor that can make you trip. What can I do with my stairs? Do not leave any items on the stairs. Make sure that there are handrails on both sides of the stairs and use them. Fix handrails that are broken or loose. Make sure that handrails are as long as the stairways. Check any carpeting to make sure that it is firmly attached  to the stairs. Fix any carpet that is loose or worn. Avoid having throw rugs at the top or bottom of the stairs. If you do have throw rugs, attach them to the floor with carpet tape. Make sure that you have a light switch at the top of the stairs and the bottom of the stairs. If you do not have them, ask someone to add them for you. What else can I do to help prevent falls? Wear shoes that: Do not have high heels. Have rubber bottoms. Are comfortable and fit you well. Are closed at the toe. Do not wear sandals. If you use a stepladder: Make sure that it is fully opened. Do not climb a closed stepladder. Make sure that both sides of the stepladder  are locked into place. Ask someone to hold it for you, if possible. Clearly mark and make sure that you can see: Any grab bars or handrails. First and last steps. Where the edge of each step is. Use tools that help you move around (mobility aids) if they are needed. These include: Canes. Walkers. Scooters. Crutches. Turn on the lights when you go into a dark area. Replace any light bulbs as soon as they burn out. Set up your furniture so you have a clear path. Avoid moving your furniture around. If any of your floors are uneven, fix them. If there are any pets around you, be aware of where they are. Review your medicines with your doctor. Some medicines can make you feel dizzy. This can increase your chance of falling. Ask your doctor what other things that you can do to help prevent falls. This information is not intended to replace advice given to you by your health care provider. Make sure you discuss any questions you have with your health care provider. Document Released: 11/15/2008 Document Revised: 06/27/2015 Document Reviewed: 02/23/2014 Elsevier Interactive Patient Education  2017 [employer].

## 2021-01-28 ENCOUNTER — Other Ambulatory Visit: Payer: Self-pay | Admitting: Family Medicine

## 2021-01-29 ENCOUNTER — Other Ambulatory Visit: Payer: Self-pay | Admitting: Family Medicine

## 2021-02-07 ENCOUNTER — Ambulatory Visit (INDEPENDENT_AMBULATORY_CARE_PROVIDER_SITE_OTHER): Payer: Medicare PPO | Admitting: Family Medicine

## 2021-02-07 ENCOUNTER — Encounter: Payer: Self-pay | Admitting: Family Medicine

## 2021-02-07 VITALS — BP 140/66 | HR 85 | Temp 98.4°F | Ht 65.0 in | Wt 217.1 lb

## 2021-02-07 DIAGNOSIS — I10 Essential (primary) hypertension: Secondary | ICD-10-CM

## 2021-02-07 DIAGNOSIS — Z23 Encounter for immunization: Secondary | ICD-10-CM | POA: Diagnosis not present

## 2021-02-07 DIAGNOSIS — I700 Atherosclerosis of aorta: Secondary | ICD-10-CM

## 2021-02-07 DIAGNOSIS — Z0000 Encounter for general adult medical examination without abnormal findings: Secondary | ICD-10-CM

## 2021-02-07 LAB — COMPREHENSIVE METABOLIC PANEL
ALT: 38 U/L (ref 0–53)
AST: 36 U/L (ref 0–37)
Albumin: 4.3 g/dL (ref 3.5–5.2)
Alkaline Phosphatase: 201 U/L — ABNORMAL HIGH (ref 39–117)
BUN: 12 mg/dL (ref 6–23)
CO2: 26 mEq/L (ref 19–32)
Calcium: 9.3 mg/dL (ref 8.4–10.5)
Chloride: 103 mEq/L (ref 96–112)
Creatinine, Ser: 0.83 mg/dL (ref 0.40–1.50)
GFR: 90.04 mL/min (ref >60.00)
Glucose, Bld: 97 mg/dL (ref 70–99)
Potassium: 3.9 mEq/L (ref 3.5–5.1)
Sodium: 139 mEq/L (ref 135–145)
Total Bilirubin: 1 mg/dL (ref 0.2–1.2)
Total Protein: 6.6 g/dL (ref 6.0–8.3)

## 2021-02-07 LAB — LIPID PANEL
Cholesterol: 141 mg/dL (ref 0–200)
HDL: 40.1 mg/dL (ref >39.00)
LDL Cholesterol: 71 mg/dL (ref 0–99)
NonHDL: 100.48
Total CHOL/HDL Ratio: 4
Triglycerides: 146 mg/dL (ref 0.0–149.0)
VLDL: 29.2 mg/dL (ref 0.0–40.0)

## 2021-02-07 MED ORDER — OLMESARTAN MEDOXOMIL 20 MG PO TABS: 20.0000 mg | ORAL_TABLET | Freq: Every day | ORAL | 2 refills | Status: DC

## 2021-02-07 NOTE — Patient Instructions (Addendum)
Give [place] 2-3 business days to get the results of your labs back.   Keep the diet clean and stay active.  Reach out to [facility] for your colon cancer screening.  The new Shingrix vaccine (for shingles) is a 2 shot series. It can make people feel low energy, achy and almost like they have the flu for 48 hours after injection. Please plan accordingly when deciding on when to get this shot. Call your pharmacy to get this. The second shot of the series is less severe regarding the side effects, but it still lasts 48 hours.   Let us know if you need anything.

## 2021-02-07 NOTE — Progress Notes (Signed)
Chief Complaint  Patient presents with   Annual Exam    Well Male Cameron Woods is here for a complete physical.   His last physical was >1 year ago.  Current diet: in general, a "healthy" diet.   Current exercise: none Weight trend: stable Fatigue out of ordinary? No. Seat belt? Yes.   Advanced directive? No  Health maintenance Shingrix- No Colonoscopy- Due Tetanus- Yes Hep C- Yes Pneumonia vaccine- Due for PCV20  Hypertension Patient presents for hypertension follow up. He does monitor home blood pressures. Blood pressures ranging on average from 130-150's/70's. He is compliant with medication- Norvasc 10 mg/d. Patient has these side effects of medication: none Diet/exercise as above.   Past Medical History:  Diagnosis Date   Aortic atherosclerosis (HCC)    Hyperlipidemia    Hypertension    NAFLD (nonalcoholic fatty liver disease)    Sleep apnea      Past Surgical History:  Procedure Laterality Date   TONSILLECTOMY      Medications  Current Outpatient Medications on File Prior to Visit  Medication Sig Dispense Refill   amLODipine (NORVASC) 10 MG tablet Take 1 tablet (10 mg total) by mouth daily. 30 tablet 3   aspirin 81 MG EC tablet Take 1 tablet (81 mg total) by mouth daily. Swallow whole. 30 tablet 12   clonazePAM (KLONOPIN) 0.5 MG tablet Take 0.5 mg by mouth.     pantoprazole (PROTONIX) 40 MG tablet TAKE 1 TABLET BY MOUTH 2 TIMES A DAY FORACID 60 tablet 3   rosuvastatin (CRESTOR) 20 MG tablet TAKE 1 TABLET BY MOUTH EVERY DAY FOR CHOLESTEROL 90 tablet 1   Allergies No Known Allergies  Family History Family History  Problem Relation Age of Onset   Stroke Mother    Cancer Father        prostate cancer   Hypertension Sister    Cancer Brother        prostate cancer    Review of Systems: Constitutional:  no fevers Eye:  no recent significant change in vision Ears:  No changes in hearing Nose/Mouth/Throat:  no complaints of nasal congestion, no sore  throat Cardiovascular: no chest pain Respiratory:  No shortness of breath Gastrointestinal:  No change in bowel habits GU:  No frequency Integumentary:  no abnormal skin lesions reported Neurologic:  no headaches Endocrine:  denies unexplained weight changes  Exam BP 140/66    Pulse 85    Temp 98.4 F (36.9 C) (Oral)    Ht 5\' 5"  (1.651 m)    Wt 217 lb 2 oz (98.5 kg)    SpO2 98%    BMI 36.13 kg/m  General:  well developed, well nourished, in no apparent distress Skin:  no significant moles, warts, or growths Head:  no masses, lesions, or tenderness Eyes:  pupils equal and round, sclera anicteric without injection Ears:  canals without lesions, TMs shiny without retraction, no obvious effusion, no erythema Nose:  nares patent, septum midline, mucosa normal Throat/Pharynx:  lips and gingiva without lesion; tongue and uvula midline; non-inflamed pharynx; no exudates or postnasal drainage Lungs:  clear to auscultation, breath sounds equal bilaterally, no respiratory distress Cardio:  regular rate and rhythm, no LE edema or bruits Rectal: Deferred GI: BS+, S, NT, ND, no masses or organomegaly Musculoskeletal:  symmetrical muscle groups noted without atrophy or deformity Neuro:  gait normal; deep tendon reflexes normal and symmetric Psych: well oriented with normal range of affect and appropriate judgment/insight  Assessment and Plan  Well  adult exam  Aortic atherosclerosis (HCC) - Plan: Lipid panel, Comprehensive metabolic panel  Essential hypertension - Plan: olmesartan (BENICAR) 20 MG tablet, Basic metabolic panel  Need for vaccination against Streptococcus pneumoniae - Plan: Pneumococcal conjugate vaccine 20-valent (Prevnar 20)   Well 72 y.o. male. Counseled on diet and exercise. Other orders as above. HTN: Chronic, uncontrolled. Cont Norvasc 10 mg/d. Start olmesartan 20 mg/d. Monitor BP at home. F/u in 1 week for BMET, 1 mo for reck w me.  Advanced directive form provided  today.  He will reach out to GI team at [facility] [place] for CCS. Shingrix rec'd. PCV20 today.  The patient voiced understanding and agreement to the plan.  Jilda Roche [place], DO 02/07/21 1:25 PM

## 2021-02-12 ENCOUNTER — Telehealth: Payer: Self-pay | Admitting: *Deleted

## 2021-02-12 NOTE — Telephone Encounter (Signed)
Pt has lab appointment tomorrow. There is a future order for hepatic function panel date 10/08/20.  Do we need to draw that tomorrow as well or should I cancel the test?

## 2021-02-12 NOTE — Telephone Encounter (Signed)
OK to cancel that hep function panel, ty.

## 2021-02-13 ENCOUNTER — Other Ambulatory Visit (INDEPENDENT_AMBULATORY_CARE_PROVIDER_SITE_OTHER): Payer: Medicare PPO

## 2021-02-13 DIAGNOSIS — I10 Essential (primary) hypertension: Secondary | ICD-10-CM | POA: Diagnosis not present

## 2021-02-13 NOTE — Telephone Encounter (Signed)
Order updated

## 2021-02-14 LAB — BASIC METABOLIC PANEL
BUN: 10 mg/dL (ref 6–23)
CO2: 26 mEq/L (ref 19–32)
Calcium: 8.5 mg/dL (ref 8.4–10.5)
Chloride: 104 mEq/L (ref 96–112)
Creatinine, Ser: 0.84 mg/dL (ref 0.40–1.50)
GFR: 89.7 mL/min (ref >60.00)
Glucose, Bld: 190 mg/dL — ABNORMAL HIGH (ref 70–99)
Potassium: 3.8 mEq/L (ref 3.5–5.1)
Sodium: 138 mEq/L (ref 135–145)

## 2021-02-25 ENCOUNTER — Other Ambulatory Visit: Payer: Self-pay | Admitting: Family Medicine

## 2021-03-10 ENCOUNTER — Encounter: Payer: Self-pay | Admitting: Family Medicine

## 2021-03-10 ENCOUNTER — Ambulatory Visit (INDEPENDENT_AMBULATORY_CARE_PROVIDER_SITE_OTHER): Payer: Medicare PPO | Admitting: Family Medicine

## 2021-03-10 VITALS — BP 128/64 | HR 85 | Temp 98.2°F | Ht 65.0 in | Wt 216.4 lb

## 2021-03-10 DIAGNOSIS — I10 Essential (primary) hypertension: Secondary | ICD-10-CM | POA: Diagnosis not present

## 2021-03-10 NOTE — Patient Instructions (Addendum)
Keep the diet clean and stay active.  No changes today.   When you check your BP, make sure you have been doing something calm/relaxing 5 minutes prior to checking. Both feet should be flat on the floor and you should be sitting. Use your left arm and make sure it is in a relaxed position (on a table), and that the cuff is at the approximate level/height of your heart.   Bring your monitor to your nurse visit to compare.  Let us know if you need anything.

## 2021-03-10 NOTE — Progress Notes (Signed)
Chief Complaint  Patient presents with   Follow-up    Subjective Cameron Woods is a 71 y.o. male who presents for hypertension follow up. He does monitor home blood pressures. Blood pressures ranging from 130-140's/60-70's on average. He is compliant with medications- Norvasc 10 mg/d, olmesartan 20 mg/d recently added. Patient has these side effects of medication: none He is sometimes adhering to a healthy diet overall. Current exercise: walking, active in yard No Cp or SOB.    Past Medical History:  Diagnosis Date   Aortic atherosclerosis (HCC)    Hyperlipidemia    Hypertension    NAFLD (nonalcoholic fatty liver disease)    Sleep apnea     Exam BP 128/64    Pulse 85    Temp 98.2 F (36.8 C) (Oral)    Ht 5\' 5"  (1.651 m)    Wt 216 lb 6 oz (98.1 kg)    SpO2 97%    BMI 36.01 kg/m  General:  well developed, well nourished, in no apparent distress Heart: RRR, no bruits, no LE edema Lungs: clear to auscultation, no accessory muscle use Psych: well oriented with normal range of affect and appropriate judgment/insight  Essential hypertension  Chronic, probably stable. Cont Norvasc 5 mg/d, olmesartan 20 mg/d. Monitor BP at home. Bring BP monitor to nurse visit in 2 weeks. If home monitor accurate, would increase olmesartan to 40 mg/d and reck w me in another 2 weeks. If controlled, f/u as originally scheduled. Counseled on diet and exercise. F/u in 5 mo for med ck pending nurse visit results. The patient voiced understanding and agreement to the plan.  [place], DO 03/10/21  1:19 PM

## 2021-03-24 ENCOUNTER — Ambulatory Visit (INDEPENDENT_AMBULATORY_CARE_PROVIDER_SITE_OTHER): Payer: Medicare PPO | Admitting: Family Medicine

## 2021-03-24 VITALS — BP 139/75 | HR 75

## 2021-03-24 DIAGNOSIS — I10 Essential (primary) hypertension: Secondary | ICD-10-CM | POA: Diagnosis not present

## 2021-03-24 NOTE — Progress Notes (Signed)
Pt here for Blood pressure check per  Dr. Carmelia Roller.  Pt currently takes: amlodipine 10mg , olmesartan 20MG  Pt reports compliance with medication. . BP Readings from Last 3 Encounters:  03/10/21 128/64  02/07/21 140/66  09/10/20 132/68     BP today: R arm 142/75 L arm 135/70, after 5 minutes R 139/75 L 139/75 HR = 75  Pt brought his at home BP machine, BP in office is 164/79 HR 85.  Pt advised to continue current medications, upgrade BP machine and follow up in July per Dr. 11/10/20.

## 2021-04-23 ENCOUNTER — Encounter: Payer: Self-pay | Admitting: Gastroenterology

## 2021-04-23 ENCOUNTER — Ambulatory Visit: Payer: Medicare PPO | Admitting: Gastroenterology

## 2021-04-23 ENCOUNTER — Other Ambulatory Visit (INDEPENDENT_AMBULATORY_CARE_PROVIDER_SITE_OTHER): Payer: Medicare PPO

## 2021-04-23 VITALS — BP 126/76 | HR 80 | Ht 65.0 in | Wt 219.0 lb

## 2021-04-23 DIAGNOSIS — R7989 Other specified abnormal findings of blood chemistry: Secondary | ICD-10-CM

## 2021-04-23 DIAGNOSIS — Z86010 Personal history of colonic polyps: Secondary | ICD-10-CM

## 2021-04-23 DIAGNOSIS — Z8719 Personal history of other diseases of the digestive system: Secondary | ICD-10-CM | POA: Diagnosis not present

## 2021-04-23 LAB — CBC WITH DIFFERENTIAL/PLATELET
Basophils Absolute: 0.1 10*3/uL (ref 0.0–0.1)
Basophils Relative: 1.2 % (ref 0.0–3.0)
Eosinophils Absolute: 0.1 10*3/uL (ref 0.0–0.7)
Eosinophils Relative: 2.8 % (ref 0.0–5.0)
HCT: 41.6 % (ref 39.0–52.0)
Hemoglobin: 14.3 g/dL (ref 13.0–17.0)
Lymphocytes Relative: 26.3 % (ref 12.0–46.0)
Lymphs Abs: 1.3 10*3/uL (ref 0.7–4.0)
MCHC: 34.4 g/dL (ref 30.0–36.0)
MCV: 95.2 fl (ref 78.0–100.0)
Monocytes Absolute: 0.4 10*3/uL (ref 0.1–1.0)
Monocytes Relative: 7.4 % (ref 3.0–12.0)
Neutro Abs: 3.1 10*3/uL (ref 1.4–7.7)
Neutrophils Relative %: 62.3 % (ref 43.0–77.0)
Platelets: 81 10*3/uL — ABNORMAL LOW (ref 150.0–400.0)
RBC: 4.37 Mil/uL (ref 4.22–5.81)
RDW: 13.4 % (ref 11.5–15.5)
WBC: 4.9 10*3/uL (ref 4.0–10.5)

## 2021-04-23 LAB — COMPREHENSIVE METABOLIC PANEL
ALT: 45 U/L (ref 0–53)
AST: 47 U/L — ABNORMAL HIGH (ref 0–37)
Albumin: 4.3 g/dL (ref 3.5–5.2)
Alkaline Phosphatase: 200 U/L — ABNORMAL HIGH (ref 39–117)
BUN: 15 mg/dL (ref 6–23)
CO2: 26 mEq/L (ref 19–32)
Calcium: 9.2 mg/dL (ref 8.4–10.5)
Chloride: 103 mEq/L (ref 96–112)
Creatinine, Ser: 0.87 mg/dL (ref 0.40–1.50)
GFR: 88.64 mL/min (ref >60.00)
Glucose, Bld: 111 mg/dL — ABNORMAL HIGH (ref 70–99)
Potassium: 4.3 mEq/L (ref 3.5–5.1)
Sodium: 137 mEq/L (ref 135–145)
Total Bilirubin: 0.8 mg/dL (ref 0.2–1.2)
Total Protein: 6.8 g/dL (ref 6.0–8.3)

## 2021-04-23 MED ORDER — FAMOTIDINE 20 MG PO TABS: 20.0000 mg | ORAL_TABLET | Freq: Every day | ORAL | 0 refills | Status: AC

## 2021-04-23 NOTE — Patient Instructions (Addendum)
If you are age 69 or older, your body mass index should be between 23-30. Your Body mass index is 36.44 kg/m?Marland Kitchen If this is out of the aforementioned range listed, please consider follow up with your Primary Care Provider. ?________________________________________________________ ? ?The [facility] providers would like to encourage you to use [facility] to communicate with providers for non-urgent requests or questions.  Due to long hold times on the telephone, sending your provider a message by [facility] may be a faster and more efficient way to get a response.  Please allow 48 business hours for a response.  Please remember that this is for non-urgent requests.  ?_______________________________________________________ ? ?Your provider has requested that you go to the basement level for lab work before leaving today. Press "B" on the elevator. The lab is located at the first door on the left as you exit the elevator. ? ?Change the way you are taking your Pantoprazole 40 mg to 1 tablet in the morning before breakfast. ? ?START Famotidine/Pepcid 20 mg before bedtime. ? ?Thank you for entrusting me with your care and choosing [facility]. ? ?Dr Christella Hartigan ? ?

## 2021-04-23 NOTE — Progress Notes (Signed)
HPI: ?This is a very pleasant 71 year old man who was referred by his daughter who is my CMA [place]. ? ?He has a history of polyps and also a history of Barrett's. ? ?He tells me he has never had GERD symptoms but when he was diagnosed with pneumonia his pulmonologist said the pneumonia was from acid reflux.  He has been on proton pump inhibitor Protonix 80 mg at bedtime every night for quite a while.  He does not have dysphagia. ? ?He thinks his last colonoscopy was for 5 years ago.  He believes polyps were found then and those polyps were found on previous exams 2. ? ?He thinks his last EGD was about 3 to 4 years ago.  He has undergone this test 2 or 3 times. ? ?We do not have any of the [facility] records here at the time of his visit. ? ?He has no troubles with constipation, diarrhea, overt GI bleeding, significant abdominal pains, nausea or vomiting.  His weight is overall stable. ? ?He drinks beer, generally daily, 2 or 3 beers per day from what he says. ? ?Old Data Reviewed: ?Blood work January 1610 complete metabolic profile was normal except for alk phos 201 ? ?Ultrasound October 2016 indication "elevated LFTs" findings normal gallbladder, nondilated bile duct, liver was not nodular but there was some fatty liver noted. ? ? ? ?Review of systems: ?Pertinent positive and negative review of systems were noted in the above HPI section. All other review negative. ? ? ?Past Medical History:  ?Diagnosis Date  ? Aortic atherosclerosis ([place])   ? Hyperlipidemia   ? Hypertension   ? NAFLD (nonalcoholic fatty liver disease)   ? Sleep apnea   ? ? ?Past Surgical History:  ?Procedure Laterality Date  ? TONSILLECTOMY    ? ? ?Current Outpatient Medications  ?Medication Instructions  ? amLODipine (NORVASC) 10 MG tablet TAKE 1 TABLET BY MOUTH EVERY DAY  ? aspirin 81 mg, Oral, Daily, Swallow whole.  ? clonazePAM (KLONOPIN) 0.5 mg, Oral  ? olmesartan (BENICAR) 20 mg, Oral, Daily  ? pantoprazole (PROTONIX) 40 MG  tablet TAKE 1 TABLET BY MOUTH 2 TIMES A DAY FORACID  ? rosuvastatin (CRESTOR) 20 MG tablet TAKE 1 TABLET BY MOUTH EVERY DAY FOR CHOLESTEROL  ? ? ?Allergies as of 04/23/2021  ? (No Known Allergies)  ? ? ?Family History  ?Problem Relation Age of Onset  ? Stroke Mother   ? Cancer Father   ?     prostate cancer  ? Hypertension Sister   ? Cancer Brother   ?     prostate cancer  ? ? ?Social History  ? ?Socioeconomic History  ? Marital status: Divorced  ?  Spouse name: Not on file  ? Number of children: Not on file  ? Years of education: Not on file  ? Highest education level: Not on file  ?Occupational History  ? Not on file  ?Tobacco Use  ? Smoking status: Former  ? Smokeless tobacco: Never  ?Vaping Use  ? Vaping Use: Never used  ?Substance and Sexual Activity  ? Alcohol use: Yes  ?  Comment: occasional  ? Drug use: No  ? Sexual activity: Not on file  ?Other Topics Concern  ? Not on file  ?Social History Narrative  ? Not on file  ? ?Social Determinants of Health  ? ?Financial Resource Strain: Low Risk   ? Difficulty of Paying Living Expenses: Not hard at all  ?Food Insecurity: No Food Insecurity  ?  Worried About Charity fundraiser in the Last Year: Never true  ? Ran Out of Food in the Last Year: Never true  ?Transportation Needs: No Transportation Needs  ? Lack of Transportation (Medical): No  ? Lack of Transportation (Non-Medical): No  ?Physical Activity: Inactive  ? Days of Exercise per Week: 0 days  ? Minutes of Exercise per Session: 0 min  ?Stress: No Stress Concern Present  ? Feeling of Stress : Not at all  ?Social Connections: Socially Isolated  ? Frequency of Communication with Friends and Family: More than three times a week  ? Frequency of Social Gatherings with Friends and Family: More than three times a week  ? Attends Religious Services: Never  ? Active Member of Clubs or Organizations: No  ? Attends Archivist Meetings: Never  ? Marital Status: Divorced  ?Intimate Partner Violence: Not At Risk  ?  Fear of Current or Ex-Partner: No  ? Emotionally Abused: No  ? Physically Abused: No  ? Sexually Abused: No  ? ? ? ?Physical Exam: ?BP 126/76   Pulse 80   Ht 5' 5" (1.651 m)   Wt 219 lb (99.3 kg)   BMI 36.44 kg/m?  ?Constitutional: generally well-appearing ?Psychiatric: alert and oriented x3 ?Eyes: extraocular movements intact ?Mouth: oral pharynx moist, no lesions ?Neck: supple no lymphadenopathy ?Cardiovascular: heart regular rate and rhythm ?Lungs: clear to auscultation bilaterally ?Abdomen: soft, nontender, nondistended, no obvious ascites, no peritoneal signs, normal bowel sounds ?Extremities: no lower extremity edema bilaterally ?Skin: no lesions on visible extremities ? ? ?Assessment and plan: ?71 y.o. male with slight elevated liver test, history of colon polyps, history of Barrett'sFirst we will get records sent over from his previous gastroenterologist for my review.  I will make a decision about timing of next colonoscopy and upper endoscopy and advised him when I see those records. ? ?Second I do not like the way he is taking his proton pump inhibitor.  He is taking Protonix 80 mg at bedtime every night.  I have advised him instead to take Protonix 40 mg 20 to 30 minutes prior to his breakfast meal and then a Pepcid/famotidine 20 mg pills 1 pill at bedtime every night. ? ?He will get a basic set of labs today including CBC, complete metabolic profile as well. ? ? ?Please see the "Patient Instructions" section for addition details about the plan. ? ? ?Owens Loffler, MD ?[facility] ?04/23/2021, 10:20 AM ? ?Cc: Shelda Pal* ? ?Total time on date of encounter was 46  minutes (this included time spent preparing to see the patient reviewing records; obtaining and/or reviewing separately obtained history; performing a medically appropriate exam and/or evaluation; counseling and educating the patient and family if present; ordering medications, tests or procedures if applicable; and  documenting clinical information in the health record). ? ? ?

## 2021-04-30 ENCOUNTER — Other Ambulatory Visit: Payer: Self-pay | Admitting: Family Medicine

## 2021-04-30 DIAGNOSIS — I10 Essential (primary) hypertension: Secondary | ICD-10-CM

## 2021-05-02 ENCOUNTER — Other Ambulatory Visit: Payer: Self-pay

## 2021-05-02 DIAGNOSIS — R7989 Other specified abnormal findings of blood chemistry: Secondary | ICD-10-CM

## 2021-05-07 ENCOUNTER — Ambulatory Visit: Payer: Medicare PPO | Admitting: Physician Assistant

## 2021-05-08 ENCOUNTER — Ambulatory Visit (HOSPITAL_COMMUNITY)
Admission: RE | Admit: 2021-05-08 | Discharge: 2021-05-08 | Disposition: A | Discharge status: Home / Self Care | Payer: Medicare PPO | Source: Ambulatory Visit | Attending: Gastroenterology | Admitting: Gastroenterology

## 2021-05-08 ENCOUNTER — Other Ambulatory Visit (INDEPENDENT_AMBULATORY_CARE_PROVIDER_SITE_OTHER): Payer: Medicare PPO

## 2021-05-08 DIAGNOSIS — R7989 Other specified abnormal findings of blood chemistry: Secondary | ICD-10-CM | POA: Diagnosis not present

## 2021-05-08 DIAGNOSIS — K7689 Other specified diseases of liver: Secondary | ICD-10-CM | POA: Diagnosis not present

## 2021-05-08 DIAGNOSIS — R161 Splenomegaly, not elsewhere classified: Secondary | ICD-10-CM | POA: Diagnosis not present

## 2021-05-08 LAB — IBC + FERRITIN
Ferritin: 167.6 ng/mL (ref 22.0–322.0)
Iron: 138 ug/dL (ref 42–165)
Saturation Ratios: 47.2 % (ref 20.0–50.0)
TIBC: 292.6 ug/dL (ref 250.0–450.0)
Transferrin: 209 mg/dL — ABNORMAL LOW (ref 212.0–360.0)

## 2021-05-08 LAB — PROTIME-INR
INR: 1.1 ratio — ABNORMAL HIGH (ref 0.8–1.0)
Prothrombin Time: 11.9 s (ref 9.6–13.1)

## 2021-05-13 DIAGNOSIS — G4733 Obstructive sleep apnea (adult) (pediatric): Secondary | ICD-10-CM | POA: Diagnosis not present

## 2021-05-13 DIAGNOSIS — G4761 Periodic limb movement disorder: Secondary | ICD-10-CM | POA: Diagnosis not present

## 2021-05-13 LAB — HEPATITIS B SURFACE ANTIBODY,QUALITATIVE: Hep B S Ab: NONREACTIVE

## 2021-05-13 LAB — HEPATITIS C ANTIBODY
Hepatitis C Ab: NONREACTIVE
SIGNAL TO CUT-OFF: 0.09 (ref <1.00)

## 2021-05-13 LAB — IGA: Immunoglobulin A: 164 mg/dL (ref 70–320)

## 2021-05-13 LAB — ANA: Anti Nuclear Antibody (ANA): NEGATIVE

## 2021-05-13 LAB — MITOCHONDRIAL ANTIBODIES: Mitochondrial M2 Ab, IgG: <=20 U (ref <=20.0)

## 2021-05-13 LAB — CERULOPLASMIN: Ceruloplasmin: 23 mg/dL (ref 18–36)

## 2021-05-13 LAB — ALPHA-1-ANTITRYPSIN: A-1 Antitrypsin, Ser: 91 mg/dL (ref 83–199)

## 2021-05-13 LAB — HEPATITIS A ANTIBODY, TOTAL: Hepatitis A AB,Total: REACTIVE — AB

## 2021-05-13 LAB — HEPATITIS B SURFACE ANTIGEN: Hepatitis B Surface Ag: NONREACTIVE

## 2021-05-13 LAB — TISSUE TRANSGLUTAMINASE, IGA: (tTG) Ab, IgA: <1 U/mL

## 2021-05-14 ENCOUNTER — Ambulatory Visit (INDEPENDENT_AMBULATORY_CARE_PROVIDER_SITE_OTHER): Payer: Medicare PPO | Admitting: Orthopaedic Surgery

## 2021-05-14 ENCOUNTER — Ambulatory Visit (INDEPENDENT_AMBULATORY_CARE_PROVIDER_SITE_OTHER): Payer: Medicare PPO

## 2021-05-14 VITALS — Ht 65.0 in | Wt 219.0 lb

## 2021-05-14 DIAGNOSIS — M25562 Pain in left knee: Secondary | ICD-10-CM

## 2021-05-14 DIAGNOSIS — G8929 Other chronic pain: Secondary | ICD-10-CM

## 2021-05-14 NOTE — Progress Notes (Signed)
The patient is a 71 year old gentleman who comes in today with 3 months of left knee pain after he stepped in a hole and twisted his knee.  He was having a lot of pain in the back of his knee.  He says he woke up today and his knee is much better overall is not really hurting.  It was a twisting activity that he had happened to that knee.  He tried a knee sleeve but he feels like that made it worse. ? ?Examination of his left knee today is entirely normal.  His McMurray's and Lachman's exams are negative.  His range of motion is full and there is no instability on exam.  There is no effusion. ? ?2 views of the left knee show neutral alignment of the knee with weightbearing films.  There is no effusion and no malalignment and the joint space is well-maintained. ? ?Since he is feeling so well right now follow-up is just as needed with no intervention.  If his pain does come back, neck step would be considering a steroid injection.  All questions and concerns were answered and addressed. ?

## 2021-05-15 ENCOUNTER — Other Ambulatory Visit: Payer: Self-pay

## 2021-05-15 DIAGNOSIS — I8500 Esophageal varices without bleeding: Secondary | ICD-10-CM

## 2021-05-15 DIAGNOSIS — Z8719 Personal history of other diseases of the digestive system: Secondary | ICD-10-CM

## 2021-05-27 ENCOUNTER — Ambulatory Visit (AMBULATORY_SURGERY_CENTER): Payer: Medicare PPO | Admitting: Gastroenterology

## 2021-05-27 ENCOUNTER — Encounter: Payer: Self-pay | Admitting: Gastroenterology

## 2021-05-27 VITALS — BP 121/66 | HR 71 | Temp 98.7°F | Resp 19 | Ht 65.0 in | Wt 219.0 lb

## 2021-05-27 DIAGNOSIS — Z8719 Personal history of other diseases of the digestive system: Secondary | ICD-10-CM

## 2021-05-27 DIAGNOSIS — K2970 Gastritis, unspecified, without bleeding: Secondary | ICD-10-CM | POA: Diagnosis not present

## 2021-05-27 DIAGNOSIS — R7989 Other specified abnormal findings of blood chemistry: Secondary | ICD-10-CM | POA: Diagnosis not present

## 2021-05-27 DIAGNOSIS — K259 Gastric ulcer, unspecified as acute or chronic, without hemorrhage or perforation: Secondary | ICD-10-CM | POA: Diagnosis not present

## 2021-05-27 DIAGNOSIS — K317 Polyp of stomach and duodenum: Secondary | ICD-10-CM

## 2021-05-27 DIAGNOSIS — K319 Disease of stomach and duodenum, unspecified: Secondary | ICD-10-CM | POA: Diagnosis not present

## 2021-05-27 MED ORDER — SODIUM CHLORIDE 0.9 % IV SOLN: 500.0000 mL | Freq: Once | INTRAVENOUS | Status: DC

## 2021-05-27 NOTE — Op Note (Signed)
[facility] ?Patient Name: Cameron PapaJohnny Mcglocklin ?Procedure Date: 05/27/2021 10:42 AM ?MRN: [id number] ?Endoscopist: Rachael Feeaniel P Tonantzin Mimnaugh , MD ?Age: 71 ?Referring MD:  ?Date of Birth: 10/31/1950 ?Gender: Male ?Account #: [id number] ?Procedure:                Upper GI endoscopy ?Indications:              Recently diagnosed cirrhosis, h/o Barrett's ([place]  ?                          GI) ?Medicines:                Monitored Anesthesia Care ?Procedure:                Pre-Anesthesia Assessment: ?                          - Prior to the procedure, a History and Physical  ?                          was performed, and patient medications and  ?                          allergies were reviewed. The patient's tolerance of  ?                          previous anesthesia was also reviewed. The risks  ?                          and benefits of the procedure and the sedation  ?                          options and risks were discussed with the patient.  ?                          All questions were answered, and informed consent  ?                          was obtained. Prior Anticoagulants: The patient has  ?                          taken no previous anticoagulant or antiplatelet  ?                          agents. ASA Grade Assessment: III - A patient with  ?                          severe systemic disease. After reviewing the risks  ?                          and benefits, the patient was deemed in  ?                          satisfactory condition to undergo the procedure. ?  After obtaining informed consent, the endoscope was  ?                          passed under direct vision. Throughout the  ?                          procedure, the patient's blood pressure, pulse, and  ?                          oxygen saturations were monitored continuously. The  ?                          Endoscope was introduced through the mouth, and  ?                          advanced to the second part of duodenum. The upper   ?                          GI endoscopy was accomplished without difficulty.  ?                          The patient tolerated the procedure well. ?Scope In: ?Scope Out: ?Findings:                 Two trunks of small esophageal varices without  ?                          signs of recent bleeding. ?                          Mild inflammation characterized by erythema and  ?                          friability was found in the gastric antrum.  ?                          Biopsies were taken with a cold forceps for  ?                          histology. ?                          Numerous (25-30) soft, fleshy, friable gastric  ?                          polyps in the proximal 2/3 of the stomach. These  ?                          ranged in size from 3mm to 2cm. They are likely  ?                          hyperplastic polyps, I sampled several with forceps. ?                          Changes of mild  portal gastropathy throughout the  ?                          proximal stomach. No gastric varices. ?                          The exam was otherwise without abnormality. ?Complications:            No immediate complications. Estimated blood loss:  ?                          None. ?Estimated Blood Loss:     Estimated blood loss: none. ?Impression:               - Two trunks of small esophageal varices without  ?                          signs of recent bleeding. ?                          - Mild inflammation characterized by erythema and  ?                          friability was found in the gastric antrum.  ?                          Biopsies were taken with a cold forceps for  ?                          histology. ?                          - Numerous (25-30) soft, fleshy, friable gastric  ?                          polyps in the proximal 2/3 of the stomach. These  ?                          ranged in size from 5mm to 2cm. They are likely  ?                          hyperplastic polyps, I sampled several with forceps. ?                           - Changes of mild portal gastropathy throughout the  ?                          proximal stomach. No gastric varices ?Recommendation:           - Await pathology results. ?                          - Please start taking one OTC iron supplement once  ?                          daily (ferrous sulfate $Rem pills). ?Melton Alar  Christella Hartigan, MD ?05/27/2021 11:08:59 AM ?This report has been signed electronically. ?

## 2021-05-27 NOTE — Progress Notes (Signed)
Called to room to assist during endoscopic procedure.  Patient ID and intended procedure confirmed with present staff. Received instructions for my participation in the procedure from the performing physician.  

## 2021-05-27 NOTE — Progress Notes (Signed)
Pt in recovery with monitors in place, VSS. Report given to receiving RN. Bite guard was placed with pt awake to ensure comfort. No dental or soft tissue damage noted. 

## 2021-05-27 NOTE — Patient Instructions (Signed)
Biopsies taken- await pathology ? ?Continue your normal medications ?Start taking Iron over the counter iron supplement once daily (325 mg pills) ? ? ?YOU HAD AN ENDOSCOPIC PROCEDURE TODAY AT [facility]:   Refer to the procedure report that was given to you for any specific questions about what was found during the examination.  If the procedure report does not answer your questions, please call your gastroenterologist to clarify.  If you requested that your care partner not be given the details of your procedure findings, then the procedure report has been included in a sealed envelope for you to review at your convenience later. ? ?YOU SHOULD EXPECT: Some feelings of bloating in the abdomen. Passage of more gas than usual.  Walking can help get rid of the air that was put into your GI tract during the procedure and reduce the bloating.  ? ?Please Note:  You might notice some irritation and congestion in your nose or some drainage.  This is from the oxygen used during your procedure.  There is no need for concern and it should clear up in a day or so. ? ?SYMPTOMS TO REPORT IMMEDIATELY: ? ?Following upper endoscopy (EGD) ? Vomiting of blood or coffee ground material ? New chest pain or pain under the shoulder blades ? Painful or persistently difficult swallowing ? New shortness of breath ? Fever of 100?F or higher ? Black, tarry-looking stools ? ?For urgent or emergent issues, a gastroenterologist can be reached at any hour by calling [phone]. ?Do not use MyChart messaging for urgent concerns.  ? ? ?DIET:  We do recommend a small meal at first, but then you may proceed to your regular diet.  Drink plenty of fluids but you should avoid alcoholic beverages for 24 hours. ? ?ACTIVITY:  You should plan to take it easy for the rest of today and you should NOT DRIVE or use heavy machinery until tomorrow (because of the sedation medicines used during the test).   ? ?FOLLOW UP: ?Our staff will call  the number listed on your records 48-72 hours following your procedure to check on you and address any questions or concerns that you may have regarding the information given to you following your procedure. If we do not reach you, we will leave a message.  We will attempt to reach you two times.  During this call, we will ask if you have developed any symptoms of COVID 19. If you develop any symptoms (ie: fever, flu-like symptoms, shortness of breath, cough etc.) before then, please call [phone].  If you test positive for Covid 19 in the 2 weeks post procedure, please call and report this information to [place].   ? ?If any biopsies were taken you will be contacted by phone or by letter within the next 1-3 weeks.  Please call us at [phone] if you have not heard about the biopsies in 3 weeks.  ? ? ?SIGNATURES/CONFIDENTIALITY: ?You and/or your care partner have signed paperwork which will be entered into your electronic medical record.  These signatures attest to the fact that that the information above on your After Visit Summary has been reviewed and is understood.  Full responsibility of the confidentiality of this discharge information lies with you and/or your care-partner. ? ?

## 2021-05-29 ENCOUNTER — Telehealth: Payer: Self-pay

## 2021-05-29 ENCOUNTER — Encounter: Payer: Self-pay | Admitting: Gastroenterology

## 2021-05-29 NOTE — Telephone Encounter (Signed)
Attempted to reach patient for post-procedure f/u call. No answer. Left message that staff will attempt to reach him again later today and for him to please not hesitate to call us if he has questions/concerns. ?

## 2021-05-29 NOTE — Telephone Encounter (Signed)
Attempted to reach patient for post-procedure f/u call (2nd attempt). No answer. Left message for him to please not hesitate to call us if he has any questions/concerns regarding his care. 

## 2021-06-20 IMAGING — US US ABDOMINAL AORTA SCREENING AAA
1 series · 14 of 25 positions shown · non-contrast
Comparison: None.

CLINICAL DATA: Male between 65-75 years of age with a smoking
history.

EXAM:
US ABDOMINAL AORTA MEDICARE SCREENING
TECHNIQUE: Ultrasound examination of the abdominal aorta was performed as a
screening evaluation for abdominal aortic aneurysm.

[Series 1: us abdominal aorta screening aaa · 14 of 44 slices shown]
[im 1/44]
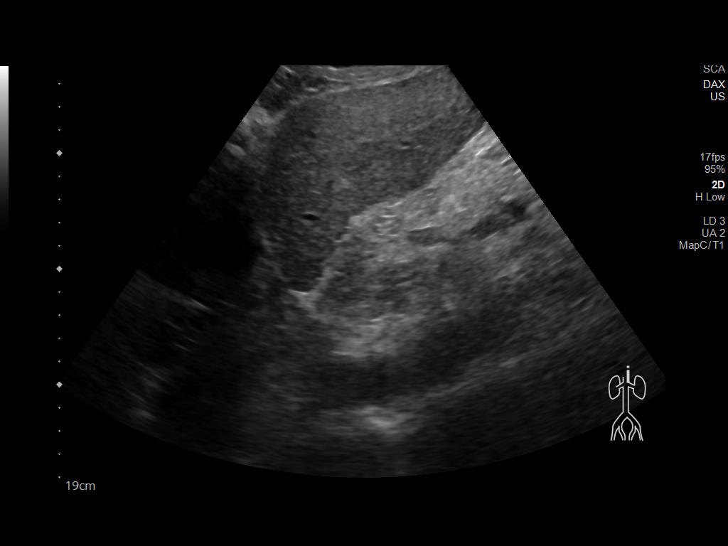
[im 4/44]
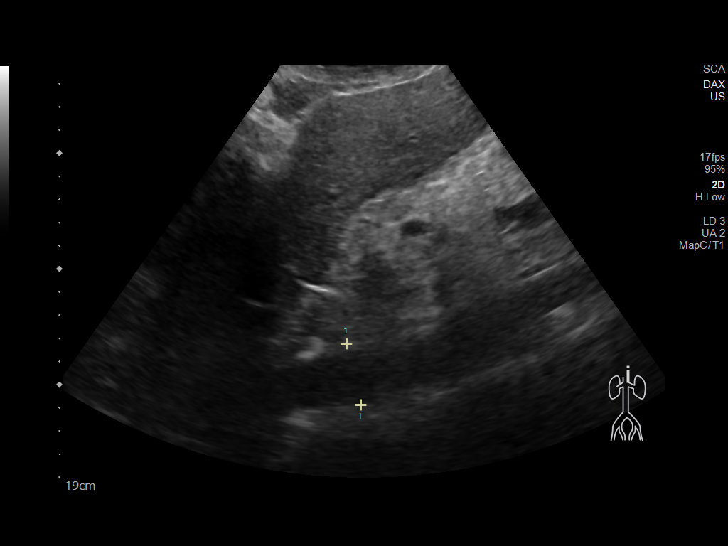
[im 8/44]
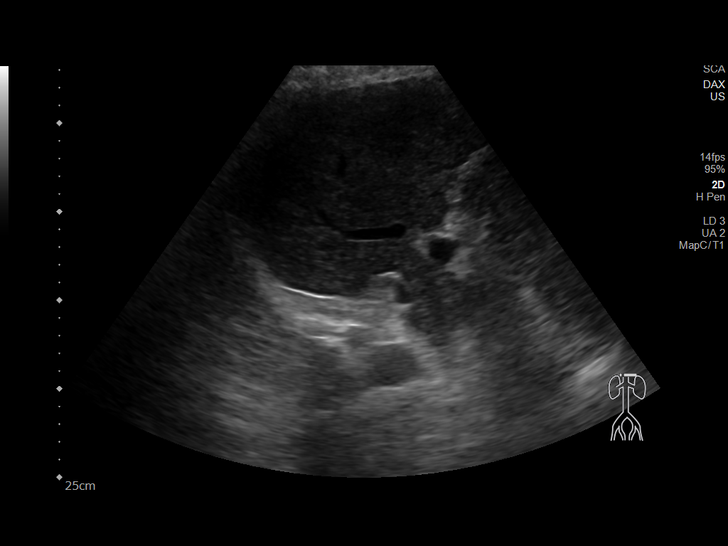
[im 11/44]
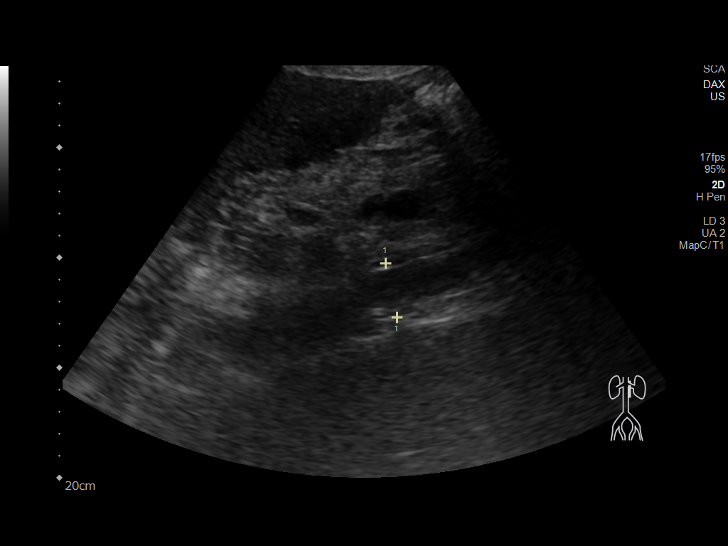
[im 15/44]
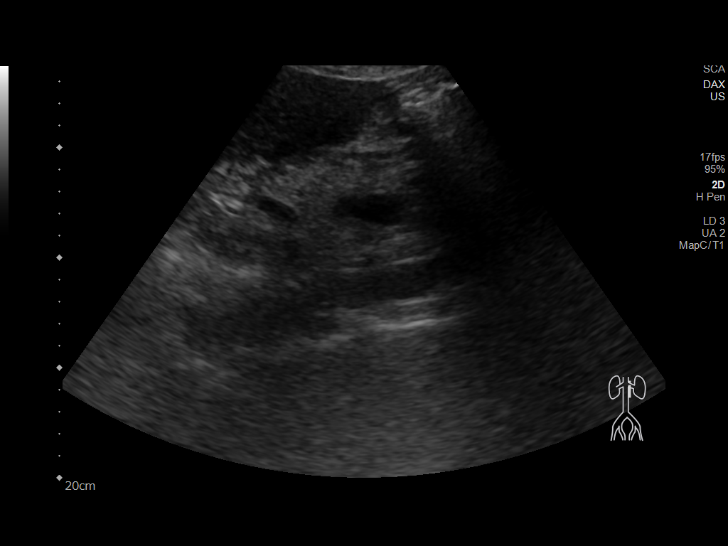
[im 17/44]
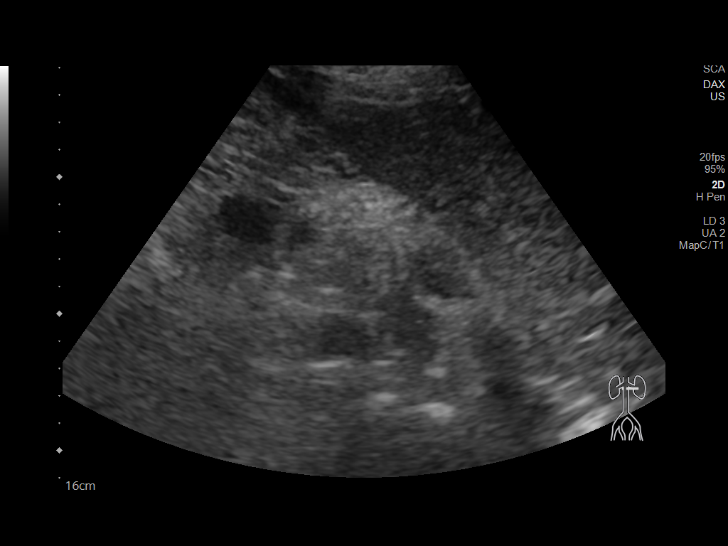
[im 20/44]
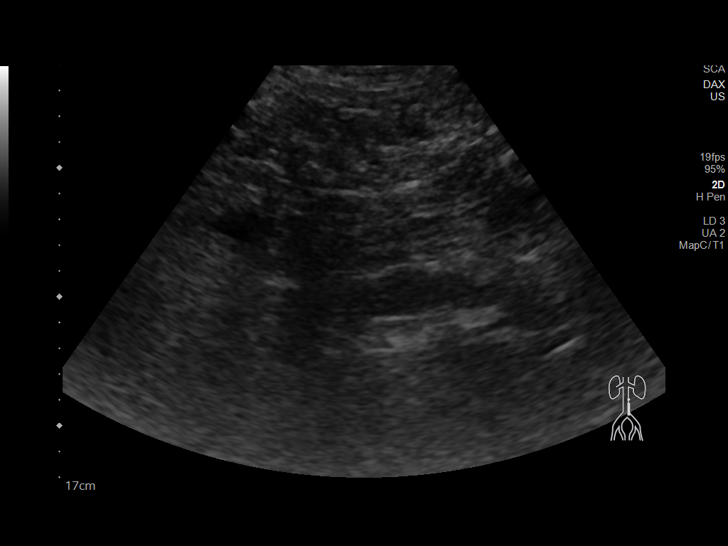
[im 24/44]
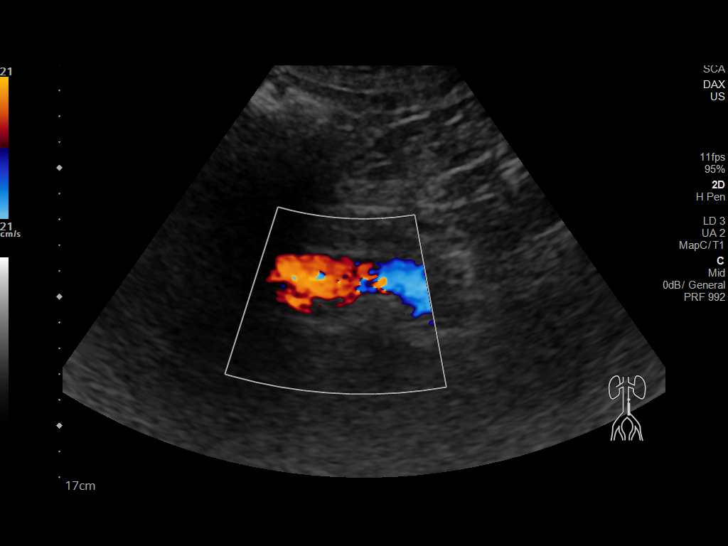
[im 27/44]
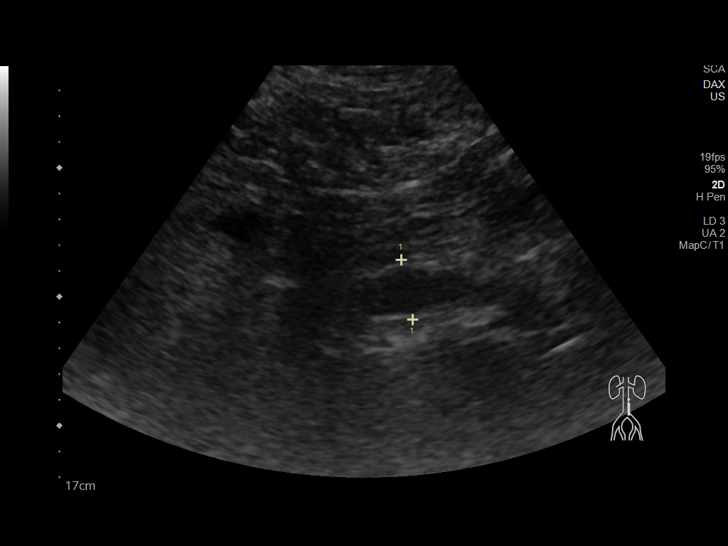
[im 29/44]
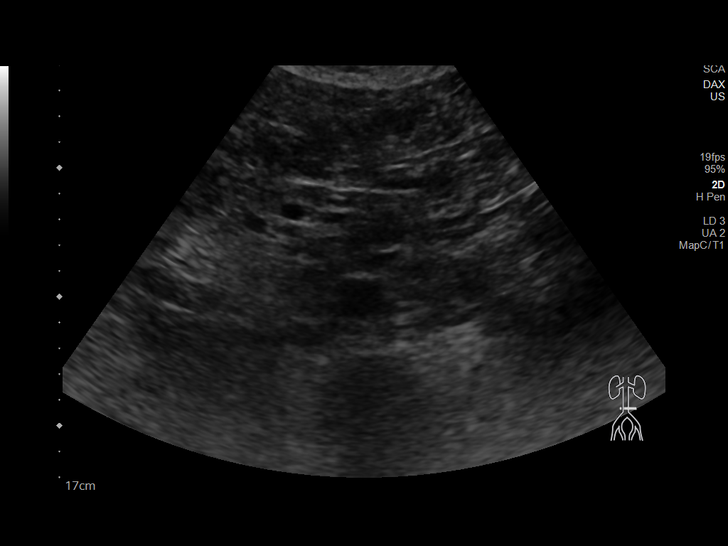
[im 33/44]
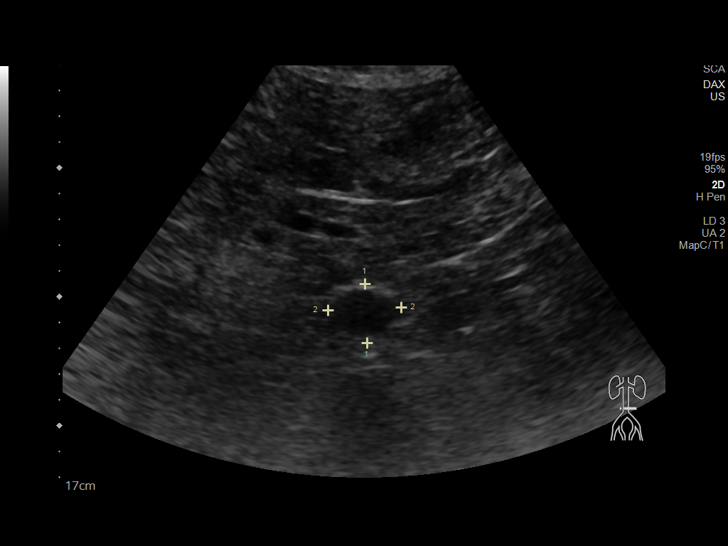
[im 36/44]
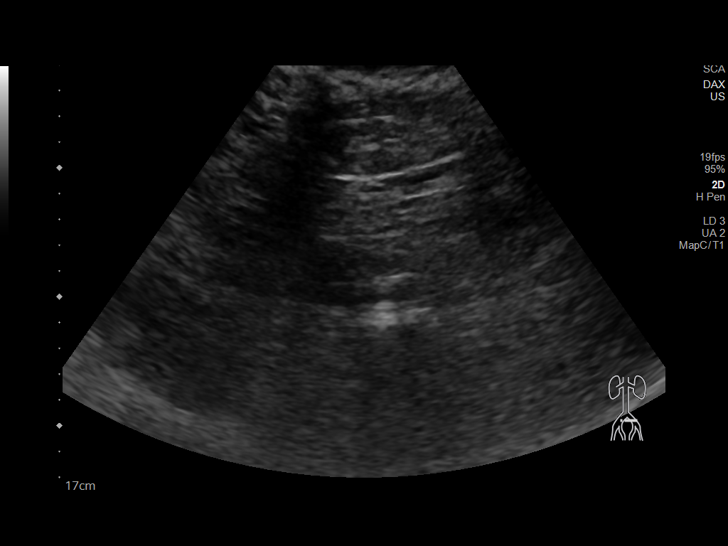
[im 40/44]
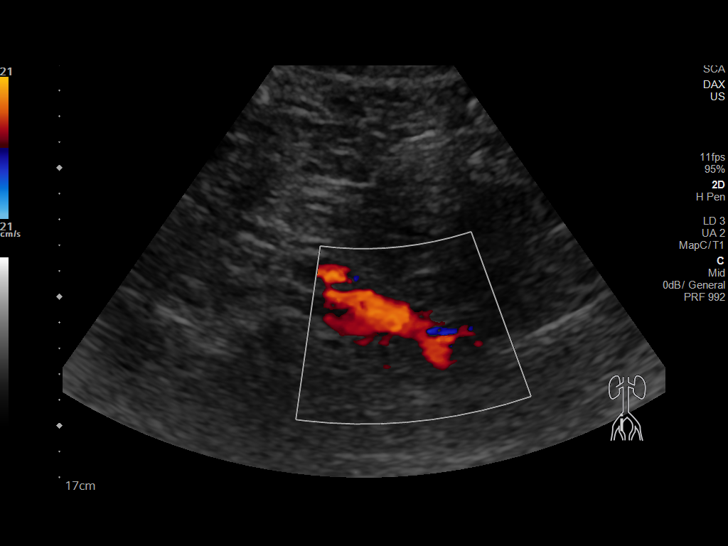
[im 44/44]
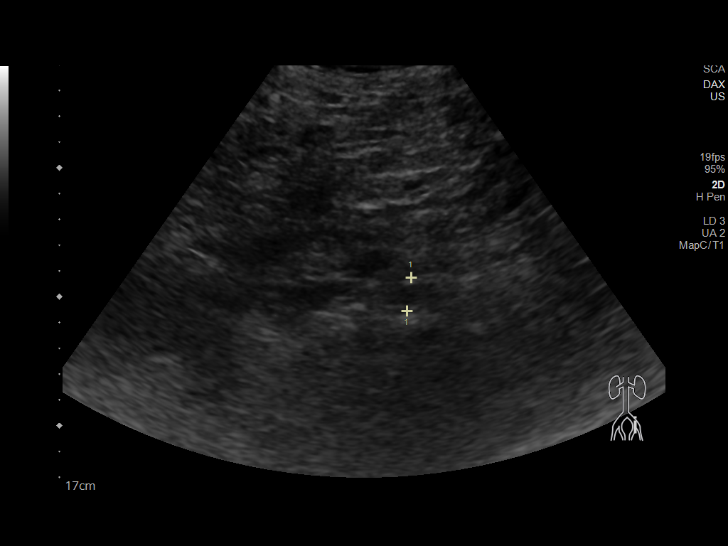

[14 of 25 positions shown; findings below may reference images not displayed]

FINDINGS: Abdominal aortic measurements as follows:

Proximal:  2.9 cm

Mid:  2.4 cm

Distal:  2.8 cm

Difficult exam because of body habitus and bowel gas. No significant
aneurysm demonstrated. Aortic atherosclerosis noted.
IMPRESSION: Aortic atherosclerosis without aneurysm.

No follow-up recommended. This recommendation follows ACR consensus
guidelines: White Paper of the ACR Incidental Findings Committee II

## 2021-07-16 DIAGNOSIS — R972 Elevated prostate specific antigen [PSA]: Secondary | ICD-10-CM | POA: Diagnosis not present

## 2021-07-22 ENCOUNTER — Other Ambulatory Visit (INDEPENDENT_AMBULATORY_CARE_PROVIDER_SITE_OTHER): Payer: Medicare PPO

## 2021-07-22 ENCOUNTER — Ambulatory Visit: Payer: Medicare PPO | Admitting: Gastroenterology

## 2021-07-22 ENCOUNTER — Encounter: Payer: Self-pay | Admitting: Gastroenterology

## 2021-07-22 VITALS — BP 110/70 | HR 82 | Ht 65.0 in | Wt 210.0 lb

## 2021-07-22 DIAGNOSIS — I8510 Secondary esophageal varices without bleeding: Secondary | ICD-10-CM

## 2021-07-22 DIAGNOSIS — K7030 Alcoholic cirrhosis of liver without ascites: Secondary | ICD-10-CM | POA: Diagnosis not present

## 2021-07-22 DIAGNOSIS — Z23 Encounter for immunization: Secondary | ICD-10-CM

## 2021-07-22 DIAGNOSIS — I8500 Esophageal varices without bleeding: Secondary | ICD-10-CM

## 2021-07-22 LAB — COMPREHENSIVE METABOLIC PANEL
ALT: 24 U/L (ref 0–53)
AST: 26 U/L (ref 0–37)
Albumin: 4.2 g/dL (ref 3.5–5.2)
Alkaline Phosphatase: 118 U/L — ABNORMAL HIGH (ref 39–117)
BUN: 8 mg/dL (ref 6–23)
CO2: 25 mEq/L (ref 19–32)
Calcium: 9.4 mg/dL (ref 8.4–10.5)
Chloride: 104 mEq/L (ref 96–112)
Creatinine, Ser: 0.93 mg/dL (ref 0.40–1.50)
GFR: 84.21 mL/min (ref >60.00)
Glucose, Bld: 129 mg/dL — ABNORMAL HIGH (ref 70–99)
Potassium: 4 mEq/L (ref 3.5–5.1)
Sodium: 136 mEq/L (ref 135–145)
Total Bilirubin: 0.7 mg/dL (ref 0.2–1.2)
Total Protein: 6.8 g/dL (ref 6.0–8.3)

## 2021-07-22 LAB — CBC
HCT: 44 % (ref 39.0–52.0)
Hemoglobin: 14.8 g/dL (ref 13.0–17.0)
MCHC: 33.7 g/dL (ref 30.0–36.0)
MCV: 93.8 fl (ref 78.0–100.0)
Platelets: 103 10*3/uL — ABNORMAL LOW (ref 150.0–400.0)
RBC: 4.69 Mil/uL (ref 4.22–5.81)
RDW: 12.5 % (ref 11.5–15.5)
WBC: 6 10*3/uL (ref 4.0–10.5)

## 2021-07-22 LAB — PROTIME-INR
INR: 1.2 ratio — ABNORMAL HIGH (ref 0.8–1.0)
Prothrombin Time: 12.8 s (ref 9.6–13.1)

## 2021-07-22 MED ORDER — NADOLOL 20 MG PO TABS: 20.0000 mg | ORAL_TABLET | Freq: Every day | ORAL | 3 refills | Status: DC

## 2021-07-22 NOTE — Patient Instructions (Addendum)
If you are age 69 or older, your body mass index should be between 23-30. Your Body mass index is 34.95 kg/m. If this is out of the aforementioned range listed, please consider follow up with your Primary Care Provider. ________________________________________________________  The [facility] providers would like to encourage you to use [facility] to communicate with providers for non-urgent requests or questions.  Due to long hold times on the telephone, sending your provider a message by [facility] may be a faster and more efficient way to get a response.  Please allow 48 business hours for a response.  Please remember that this is for non-urgent requests.  _______________________________________________________  Your provider has requested that you go to the basement level for lab work before leaving today. Press "B" on the elevator. The lab is located at the first door on the left as you exit the elevator.  We have sent the following medications to your pharmacy for you to pick up at your convenience:  START: Nadolol 20mg  one tablet daily.  Please call in 2 weeks with blood pressure and pulse readings.  CONTINUE no alcohol use.    You will have 2nd Hep B vaccine on 08-22-21 at 11:30am.  You will need a follow up appointment in 5 months (November 2023).  We will contact you to schedule this appointment.  Thank you,  Dr [phone]

## 2021-07-22 NOTE — Progress Notes (Signed)
Review of pertinent gastrointestinal problems: 1. Cirrhosis, likely from previous alcohol abuse. Ultrasound 05/2021: cirrhosis without mass lesions MELD-Na 7 (05/2021 labs) EGD 05/2021 small esophageal varices, mild portal gastropathy, multiple hyperplastic polyps throughout the stomach biopsy-proven. Blood work 05/2021: Immune to hepatitis A, normal TTG, ceruloplasmin normal, antimitochondrial antibody negative, alpha-1 antitrypsin level normal, ANA negative, ferritin normal, hepatitis C antibody negative, hepatitis B surface antibody nonreactive, hepatitis B surface antigen nonreactive, Alk phos chronically elevated around 1 30-200, AST intermittently elevated up to around 55. Last alcoholic beverage April 5956 2.  History of irregular Z-line, very short segment Barrett's (without dysplasia) however this was not seen on EGD 42023.  EGD 12/2014 Dr. Michail Sermon indication "esophageal reflux, Barrett's esophagus."  Findings 1 cm of Barrett's appearing mucosa just above the GE junction.  Gastritis.  Esophagus was biopsied and this showed "intestinal metaplasia consistent with Barrett's esophagus."  Also reflux damage.  EGD 03/2018 Dr. Michail Sermon indication "esophageal reflux, Barrett's esophagus."  Findings exactly the same as the previous findings, a very short segment Barrett's appearing mucosa at the GE junction.  Pathology "Barrett's mucosa.  Negative for dysplasia.  (1 fragment of tissue)" 3.  Colonoscopy 12/12/2014 Dr. Michail Sermon indication "personal history of colonic polyps, last colonoscopy 2007.  Hemorrhoids were noted.  The examination was otherwise normal.  He was recommended to have repeat colonoscopy at 5-year interval however I am not sure why that is the case as I do not have any previous records from [facility] gastroenterology.  He is in for recall colonoscopy 12/2024   HPI: This is a very pleasant 71 year old man whom I last saw the time of EGD about 2 months ago.  See the results summarized above.  He  has had 0 beer or other alcohol in 70 days now and is quite happy about that.    His weight is down 9 pounds since his last office visit here about 3 months ago, same scale here in our office.  No overt GI bleeding, no obvious encephalopathy, no trouble with edema.   ROS: complete GI ROS as described in HPI, all other review negative.  Constitutional:  No unintentional weight loss   Past Medical History:  Diagnosis Date   Aortic atherosclerosis (HCC)    GERD (gastroesophageal reflux disease)    Hyperlipidemia    Hypertension    NAFLD (nonalcoholic fatty liver disease)    Sleep apnea     Past Surgical History:  Procedure Laterality Date   COLONOSCOPY     TONSILLECTOMY     UPPER GASTROINTESTINAL ENDOSCOPY      Current Outpatient Medications  Medication Instructions   amLODipine (NORVASC) 10 MG tablet TAKE 1 TABLET BY MOUTH EVERY DAY   aspirin EC 81 mg, Oral, Daily, Swallow whole.   clonazePAM (KLONOPIN) 0.5 mg, Oral   famotidine (PEPCID) 20 mg, Oral, Daily at bedtime   ferrous sulfate 325 mg, Oral, 3 times daily with meals   olmesartan (BENICAR) 20 MG tablet TAKE 1 TABLET BY MOUTH EACH DAY   pantoprazole (PROTONIX) 40 MG tablet TAKE 1 TABLET BY MOUTH 2 TIMES A DAY FORACID   rosuvastatin (CRESTOR) 20 MG tablet TAKE 1 TABLET BY MOUTH EVERY DAY FOR CHOLESTEROL    Allergies as of 07/22/2021   (No Known Allergies)    Family History  Problem Relation Age of Onset   Stroke Mother    Cancer Father        prostate cancer   Hypertension Sister    Cancer Brother  prostate cancer   Colon cancer Neg Hx    Esophageal cancer Neg Hx    Rectal cancer Neg Hx    Stomach cancer Neg Hx     Social History   Socioeconomic History   Marital status: Divorced    Spouse name: Not on file   Number of children: Not on file   Years of education: Not on file   Highest education level: Not on file  Occupational History   Not on file  Tobacco Use   Smoking status: Former    Smokeless tobacco: Current    Types: Chew  Vaping Use   Vaping Use: Never used  Substance and Sexual Activity   Alcohol use: Yes    Comment: occasional   Drug use: No   Sexual activity: Not on file  Other Topics Concern   Not on file  Social History Narrative   Not on file   Social Determinants of Health   Financial Resource Strain: Low Risk  (10/08/2020)   Overall Financial Resource Strain (CARDIA)    Difficulty of Paying Living Expenses: Not hard at all  Food Insecurity: No Food Insecurity (10/08/2020)   Hunger Vital Sign    Worried About Running Out of Food in the Last Year: Never true    [place] in the Last Year: Never true  Transportation Needs: No Transportation Needs (10/08/2020)   PRAPARE - Hydrologist (Medical): No    Lack of Transportation (Non-Medical): No  Physical Activity: Inactive (10/08/2020)   Exercise Vital Sign    Days of Exercise per Week: 0 days    Minutes of Exercise per Session: 0 min  Stress: No Stress Concern Present (10/08/2020)   [place]    Feeling of Stress : Not at all  Social Connections: Socially Isolated (10/08/2020)   Social Connection and Isolation Panel [NHANES]    Frequency of Communication with Friends and Family: More than three times a week    Frequency of Social Gatherings with Friends and Family: More than three times a week    Attends Religious Services: Never    Marine scientist or Organizations: No    Attends Archivist Meetings: Never    Marital Status: Divorced  Human resources officer Violence: Not At Risk (10/08/2020)   Humiliation, Afraid, Rape, and Kick questionnaire    Fear of Current or Ex-Partner: No    Emotionally Abused: No    Physically Abused: No    Sexually Abused: No     Physical Exam: BP 110/70   Pulse 82   Ht $R'5\' 5"'MW$  (1.651 m)   Wt 210 lb (95.3 kg)   BMI 34.95 kg/m  Constitutional: generally  well-appearing Psychiatric: alert and oriented x3 Abdomen: soft, nontender, nondistended, no obvious ascites, no peritoneal signs, normal bowel sounds No peripheral edema noted in lower extremities  Assessment and plan: 71 y.o. male with newly diagnosed alcoholic cirrhosis  Most recent MELD 7, he does have small esophageal varices.  His last alcoholic beverage was about 2 months ago and I congratulated him on that.  He has lost 9 pounds as a result.    He had small esophageal varices and portal gastropathy and I am starting him on nadolol 20 mg pills 1 pill once daily today.  He will call with blood pressure, heart rate measurements in about 2 weeks from now.  He takes these daily at home.  We will  start hepatitis B immunization series today.  He will get repeat labs today to calculate an updated MELD score and also alpha-fetoprotein.  He will return to see me in 5 months and from that appointments we will schedule repeat labs and also repeat hepatoma screening ultrasound.  Please see the "Patient Instructions" section for addition details about the plan.  Owens Loffler, MD [facility] 07/22/2021, 3:45 PM   Total time on date of encounter was 25 minutes (this included time spent preparing to see the patient reviewing records; obtaining and/or reviewing separately obtained history; performing a medically appropriate exam and/or evaluation; counseling and educating the patient and family if present; ordering medications, tests or procedures if applicable; and documenting clinical information in the health record).                      We will begin hepatitis B immunization series. We will start nadolol 20 mg pills 1 pill once daily to decrease risk of bleeding from his small esophageal varices.

## 2021-07-23 DIAGNOSIS — R972 Elevated prostate specific antigen [PSA]: Secondary | ICD-10-CM | POA: Diagnosis not present

## 2021-07-24 LAB — AFP TUMOR MARKER: AFP-Tumor Marker: 3.5 ng/mL (ref <6.1)

## 2021-08-08 ENCOUNTER — Ambulatory Visit (INDEPENDENT_AMBULATORY_CARE_PROVIDER_SITE_OTHER): Payer: Medicare PPO | Admitting: Family Medicine

## 2021-08-08 ENCOUNTER — Encounter: Payer: Self-pay | Admitting: Family Medicine

## 2021-08-08 VITALS — BP 131/77 | HR 51 | Temp 98.2°F | Ht 65.0 in | Wt 210.1 lb

## 2021-08-08 DIAGNOSIS — I10 Essential (primary) hypertension: Secondary | ICD-10-CM | POA: Diagnosis not present

## 2021-08-08 DIAGNOSIS — E785 Hyperlipidemia, unspecified: Secondary | ICD-10-CM | POA: Diagnosis not present

## 2021-08-08 NOTE — Progress Notes (Signed)
Chief Complaint  Patient presents with   Follow-up    Subjective Cameron Woods is a 71 y.o. male who presents for hypertension follow up. He does monitor home blood pressures. Blood pressures ranging from 120-130's/70's on average. He is compliant with medications- Norvasc 10 mg/d, olmesartan 20 mg/d. Patient has these side effects of medication: none He is usually adhering to a healthy diet overall. Current exercise: active in yard, walking No Cp or SOB.  Hyperlipidemia Patient presents for dyslipidemia follow up. Currently being treated with Crestor 20 mg/d and compliance with treatment thus far has been good. He denies myalgias. Diet/exercise as above.  The patient is not known to have coexisting coronary artery disease.   Past Medical History:  Diagnosis Date   Aortic atherosclerosis (HCC)    GERD (gastroesophageal reflux disease)    Hyperlipidemia    Hypertension    NAFLD (nonalcoholic fatty liver disease)    Sleep apnea     Exam BP 131/77   Pulse (!) 51   Temp 98.2 F (36.8 C) (Oral)   Ht 5\' 5"  (1.651 m)   Wt 210 lb 2 oz (95.3 kg)   SpO2 96%   BMI 34.97 kg/m  General:  well developed, well nourished, in no apparent distress Heart: Reg rhythm, bradycardic, no bruits, no LE edema Lungs: clear to auscultation, no accessory muscle use Psych: well oriented with normal range of affect and appropriate judgment/insight  Essential hypertension  Hyperlipidemia, unspecified hyperlipidemia type  Chronic, stable. Cont Norvasc 10 mg/d, olmesartan 20 mg/d. Counseled on diet and exercise. Chronic, stable. Cont Crestor 20 mg/d.  F/u in 6 mo or prn. The patient voiced understanding and agreement to the plan.  [place], DO 08/08/21  12:49 PM

## 2021-08-08 NOTE — Patient Instructions (Signed)
Keep the diet clean and stay active.  Because your blood pressure is well-controlled, you no longer have to check your blood pressure at home anymore unless you wish. Some people check it twice daily every day and some people stop altogether. Either or anything in between is fine. Strong work!  Let us know if you need anything. 

## 2021-08-22 ENCOUNTER — Ambulatory Visit (INDEPENDENT_AMBULATORY_CARE_PROVIDER_SITE_OTHER): Payer: Medicare PPO | Admitting: Gastroenterology

## 2021-08-22 DIAGNOSIS — Z23 Encounter for immunization: Secondary | ICD-10-CM | POA: Diagnosis not present

## 2021-08-22 DIAGNOSIS — K7030 Alcoholic cirrhosis of liver without ascites: Secondary | ICD-10-CM

## 2021-10-13 ENCOUNTER — Ambulatory Visit: Payer: Medicare PPO

## 2021-10-14 ENCOUNTER — Ambulatory Visit: Payer: Medicare PPO

## 2021-10-24 ENCOUNTER — Ambulatory Visit (INDEPENDENT_AMBULATORY_CARE_PROVIDER_SITE_OTHER): Payer: Medicare PPO | Admitting: *Deleted

## 2021-10-24 DIAGNOSIS — Z0000 Encounter for general adult medical examination without abnormal findings: Secondary | ICD-10-CM

## 2021-10-24 NOTE — Progress Notes (Signed)
Subjective:   Cameron Woods Woods is a 71 y.o. male who presents for Medicare Annual/Subsequent preventive examination.  I connected with  Cameron Woods Woods on 10/24/21 by a audio enabled telemedicine application and verified that I am speaking with the correct person using two identifiers.  Patient Location: Home  Provider Location: Office/Clinic  I discussed the limitations of evaluation and management by telemedicine. The patient expressed understanding and agreed to proceed.   Review of Systems    Defer to PCP Cardiac Risk Factors include: advanced age (>4555men, 67>65 women);hypertension;dyslipidemia;male gender     Objective:    There were no vitals filed for this visit. There is no height or weight on file to calculate BMI.     10/24/2021    3:05 PM 10/08/2020    1:42 PM  Advanced Directives  Does Patient Have a Medical Advance Directive? Yes Yes  Type of Estate agentAdvance Directive Healthcare Power of [place];Living will Living will  Does patient want to make changes to medical advance directive? No - Patient declined   Copy of Healthcare Power of Attorney in Chart? No - copy requested     Current Medications (verified) Outpatient Encounter Medications as of 10/24/2021  Medication Sig   amLODipine (NORVASC) 10 MG tablet TAKE 1 TABLET BY MOUTH EVERY DAY   aspirin 81 MG EC tablet Take 1 tablet (81 mg total) by mouth daily. Swallow whole.   clonazePAM (KLONOPIN) 0.5 MG tablet Take 0.5 mg by mouth.   famotidine (PEPCID) 20 MG tablet Take 1 tablet (20 mg total) by mouth at bedtime.   ferrous sulfate 325 (65 FE) MG EC tablet Take 325 mg by mouth 3 (three) times daily with meals.   nadolol (CORGARD) 20 MG tablet Take 1 tablet (20 mg total) by mouth daily.   olmesartan (BENICAR) 20 MG tablet TAKE 1 TABLET BY MOUTH EACH DAY   pantoprazole (PROTONIX) 40 MG tablet TAKE 1 TABLET BY MOUTH 2 TIMES A DAY FORACID   rosuvastatin (CRESTOR) 20 MG tablet TAKE 1 TABLET BY MOUTH EVERY DAY FOR CHOLESTEROL   No  facility-administered encounter medications on file as of 10/24/2021.    Allergies (verified) Patient has no known allergies.   History: Past Medical History:  Diagnosis Date   Aortic atherosclerosis (HCC)    GERD (gastroesophageal reflux disease)    Hyperlipidemia    Hypertension    NAFLD (nonalcoholic fatty liver disease)    Sleep apnea    Past Surgical History:  Procedure Laterality Date   COLONOSCOPY     TONSILLECTOMY     UPPER GASTROINTESTINAL ENDOSCOPY     Family History  Problem Relation Age of Onset   Stroke Mother    Cancer Father        prostate cancer   Hypertension Sister    Cancer Brother        prostate cancer   Colon cancer Neg Hx    Esophageal cancer Neg Hx    Rectal cancer Neg Hx    Stomach cancer Neg Hx    Social History   Socioeconomic History   Marital status: Divorced    Spouse name: Not on file   Number of children: Not on file   Years of education: Not on file   Highest education level: Not on file  Occupational History   Not on file  Tobacco Use   Smoking status: Former   Smokeless tobacco: Current    Types: Chew  Vaping Use   Vaping Use: Never used  Substance and  Sexual Activity   Alcohol use: Yes    Comment: occasional   Drug use: No   Sexual activity: Not on file  Other Topics Concern   Not on file  Social History Narrative   Not on file   Social Determinants of Health   Financial Resource Strain: Low Risk  (10/08/2020)   Overall Financial Resource Strain (CARDIA)    Difficulty of Paying Living Expenses: Not hard at all  Food Insecurity: No Food Insecurity (10/08/2020)   Hunger Vital Sign    Worried About Running Out of Food in the Last Year: Never true    Ran Out of Food in the Last Year: Never true  Transportation Needs: No Transportation Needs (10/08/2020)   PRAPARE - Administrator, Civil Service (Medical): No    Lack of Transportation (Non-Medical): No  Physical Activity: Inactive (10/08/2020)   Exercise  Vital Sign    Days of Exercise per Week: 0 days    Minutes of Exercise per Session: 0 min  Stress: No Stress Concern Present (10/08/2020)   [employer] of Occupational Health - Occupational Stress Questionnaire    Feeling of Stress : Not at all  Social Connections: Socially Isolated (10/08/2020)   Social Connection and Isolation Panel [NHANES]    Frequency of Communication with Friends and Family: More than three times a week    Frequency of Social Gatherings with Friends and Family: More than three times a week    Attends Religious Services: Never    Database administrator or Organizations: No    Attends Engineer, structural: Never    Marital Status: Divorced    Tobacco Counseling Ready to quit: Not Answered Counseling given: Not Answered   Clinical Intake:  Pre-visit preparation completed: Yes  Pain : No/denies pain     Diabetes: No  How often do you need to have someone help you when you read instructions, pamphlets, or other written materials from your doctor or pharmacy?: 1 - Never  Diabetic? No   Activities of Daily Living    10/24/2021    3:06 PM  In your present state of health, do you have any difficulty performing the following activities:  Hearing? 1  Vision? 0  Difficulty concentrating or making decisions? 0  Walking or climbing stairs? 0  Dressing or bathing? 0  Doing errands, shopping? 0  Preparing Food and eating ? N  Using the Toilet? N  In the past six months, have you accidently leaked urine? N  Do you have problems with loss of bowel control? N  Managing your Medications? N  Managing your Finances? N  Housekeeping or managing your Housekeeping? N    Patient Care Team: Sharlene Dory, DO as PCP - General (Family Medicine)  Indicate any recent Medical Services you may have received from other than [facility] providers in the past year (date may be approximate).     Assessment:   This is a routine wellness examination for  Cameron Woods.  Hearing/Vision screen No results found.  Dietary issues and exercise activities discussed: Current Exercise Habits: The patient does not participate in regular exercise at present (just yard work), Exercise limited by: None identified   Goals Addressed   None    Depression Screen    10/24/2021    3:06 PM 02/07/2021   12:53 PM 10/08/2020    1:46 PM 09/10/2020    8:51 AM 02/06/2020    2:11 PM  PHQ 2/9 Scores  PHQ - 2  Score 0 0 0 0 0    Fall Risk    10/24/2021    3:04 PM 02/07/2021   12:53 PM 10/08/2020    1:45 PM 09/10/2020    8:51 AM  Fall Risk   Falls in the past year? 0 0 0 0  Number falls in past yr: 0 0 0 0  Injury with Fall? 0 0 0 0  Risk for fall due to : No Fall Risks No Fall Risks  No Fall Risks  Follow up Falls evaluation completed Falls evaluation completed Falls prevention discussed Falls evaluation completed    [place]:  Any stairs in or around the home? No  If so, are there any without handrails?  No stairs Home free of loose throw rugs in walkways, pet beds, electrical cords, etc? Yes  Adequate lighting in your home to reduce risk of falls? Yes   ASSISTIVE DEVICES UTILIZED TO PREVENT FALLS:  Life alert? No  Use of a cane, walker or w/c? No  Grab bars in the bathroom? Yes  Shower chair or bench in shower? Yes  Elevated toilet seat or a handicapped toilet? Yes   TIMED UP AND GO:  Was the test performed? No . Audio visit   Cognitive Function:        10/24/2021    3:12 PM  6CIT Screen  What Year? 0 points  What month? 0 points  What time? 0 points  Count back from 20 0 points  Months in reverse 0 points  Repeat phrase 4 points  Total Score 4 points    Immunizations Immunization History  Administered Date(s) Administered   Hepb-cpg 07/22/2021, 08/22/2021   Influenza,inj,Quad PF,6+ Mos 11/19/2015   Influenza,inj,quad, With Preservative 11/30/2014   PNEUMOCOCCAL CONJUGATE-20 02/07/2021   Pneumococcal  Polysaccharide-23 11/09/2018   Tdap 11/09/2018    TDAP status: Up to date  Flu Vaccine status: Declined, Education has been provided regarding the importance of this vaccine but patient still declined. Advised may receive this vaccine at local pharmacy or Health Dept. Aware to provide a copy of the vaccination record if obtained from local pharmacy or Health Dept. Verbalized acceptance and understanding.  Pneumococcal vaccine status: Up to date  Covid-19 vaccine status: Information provided on how to obtain vaccines.   Qualifies for Shingles Vaccine? Yes   Zostavax completed No   Shingrix Completed?: No.    Education has been provided regarding the importance of this vaccine. Patient has been advised to call insurance company to determine out of pocket expense if they have not yet received this vaccine. Advised may also receive vaccine at local pharmacy or Health Dept. Verbalized acceptance and understanding.  Screening Tests Health Maintenance  Topic Date Due   INFLUENZA VACCINE  04/24/2022 (Originally 09/02/2021)   COLONOSCOPY (Pts 45-7yrs Insurance coverage will need to be confirmed)  08/08/2024   TETANUS/TDAP  11/08/2028   Pneumonia Vaccine 60+ Years old  Completed   Hepatitis C Screening  Completed   HPV VACCINES  Aged Out   COVID-19 Vaccine  Discontinued   Zoster Vaccines- Shingrix  Discontinued    Health Maintenance  There are no preventive care reminders to display for this patient.   Colorectal cancer screening: Type of screening: Colonoscopy. Completed 08/09/14. Repeat every 10 years  Lung Cancer Screening: (Low Dose CT Chest recommended if Age 9-80 years, 30 pack-year currently smoking OR have quit w/in 15years.) does not qualify.   Lung Cancer Screening Referral: N/a  Additional Screening:  Hepatitis C Screening: does qualify; Completed 05/08/21  Vision Screening: Recommended annual ophthalmology exams for early detection of glaucoma and other disorders of the  eye. Is the patient up to date with their annual eye exam?  Yes  Who is the provider or what is the name of the office in which the patient attends annual eye exams? Doesn't remember name If pt is not established with a provider, would they like to be referred to a provider to establish care? No .   Dental Screening: Recommended annual dental exams for proper oral hygiene  Community Resource Referral / Chronic Care Management: CRR required this visit?  No   CCM required this visit?  No      Plan:     I have personally reviewed and noted the following in the patient's chart:   Medical and social history Use of alcohol, tobacco or illicit drugs  Current medications and supplements including opioid prescriptions. Patient is not currently taking opioid prescriptions. Functional ability and status Nutritional status Physical activity Advanced directives List of other physicians Hospitalizations, surgeries, and ER visits in previous 12 months Vitals Screenings to include cognitive, depression, and falls Referrals and appointments  In addition, I have reviewed and discussed with patient certain preventive protocols, quality metrics, and best practice recommendations. A written personalized care plan for preventive services as well as general preventive health recommendations were provided to patient.   Due to this being a telephonic visit, the after visit summary with patients personalized plan was offered to patient via mail or my-chart.  Patient was mailed a copy of AVS.  Donne Anon, CMA   10/24/2021   Nurse Notes: None

## 2021-10-24 NOTE — Patient Instructions (Signed)
[name] , Thank you for taking time to come for your Medicare Wellness Visit. I appreciate your ongoing commitment to your health goals. Please review the following plan we discussed and let me know if I can assist you in the future.   These are the goals we discussed:  Goals      Patient Stated     Eat healthier & increase activity        This is a list of the screening recommended for you and due dates:  Health Maintenance  Topic Date Due   Flu Shot  04/24/2022*   Colon Cancer Screening  08/08/2024   Tetanus Vaccine  11/08/2028   Pneumonia Vaccine  Completed   Hepatitis C Screening: USPSTF Recommendation to screen - Ages 18-79 yo.  Completed   HPV Vaccine  Aged Out   COVID-19 Vaccine  Discontinued   Zoster (Shingles) Vaccine  Discontinued  *Topic was postponed. The date shown is not the original due date.      Next appointment: Follow up in one year for your annual wellness visit.   Preventive Care 69 Years and Older, Male Preventive care refers to lifestyle choices and visits with your health care provider that can promote health and wellness. What does preventive care include? A yearly physical exam. This is also called an annual well check. Dental exams once or twice a year. Routine eye exams. Ask your health care provider how often you should have your eyes checked. Personal lifestyle choices, including: Daily care of your teeth and gums. Regular physical activity. Eating a healthy diet. Avoiding tobacco and drug use. Limiting alcohol use. Practicing safe sex. Taking low doses of aspirin every day. Taking vitamin and mineral supplements as recommended by your health care provider. What happens during an annual well check? The services and screenings done by your health care provider during your annual well check will depend on your age, overall health, lifestyle risk factors, and family history of disease. Counseling  Your health care provider may ask you  questions about your: Alcohol use. Tobacco use. Drug use. Emotional well-being. Home and relationship well-being. Sexual activity. Eating habits. History of falls. Memory and ability to understand (cognition). Work and work Statistician. Screening  You may have the following tests or measurements: Height, weight, and BMI. Blood pressure. Lipid and cholesterol levels. These may be checked every 5 years, or more frequently if you are over 8 years old. Skin check. Lung cancer screening. You may have this screening every year starting at age 58 if you have a 30-pack-year history of smoking and currently smoke or have quit within the past 15 years. Fecal occult blood test (FOBT) of the stool. You may have this test every year starting at age 32. Flexible sigmoidoscopy or colonoscopy. You may have a sigmoidoscopy every 5 years or a colonoscopy every 10 years starting at age 37. Prostate cancer screening. Recommendations will vary depending on your family history and other risks. Hepatitis C blood test. Hepatitis B blood test. Sexually transmitted disease (STD) testing. Diabetes screening. This is done by checking your blood sugar (glucose) after you have not eaten for a while (fasting). You may have this done every 1-3 years. Abdominal aortic aneurysm (AAA) screening. You may need this if you are a current or former smoker. Osteoporosis. You may be screened starting at age 29 if you are at high risk. Talk with your health care provider about your test results, treatment options, and if necessary, the need for more  tests. Vaccines  Your health care provider may recommend certain vaccines, such as: Influenza vaccine. This is recommended every year. Tetanus, diphtheria, and acellular pertussis (Tdap, Td) vaccine. You may need a Td booster every 10 years. Zoster vaccine. You may need this after age 36. Pneumococcal 13-valent conjugate (PCV13) vaccine. One dose is recommended after age  69. Pneumococcal polysaccharide (PPSV23) vaccine. One dose is recommended after age 69. Talk to your health care provider about which screenings and vaccines you need and how often you need them. This information is not intended to replace advice given to you by your health care provider. Make sure you discuss any questions you have with your health care provider. Document Released: 02/15/2015 Document Revised: 10/09/2015 Document Reviewed: 11/20/2014 Elsevier Interactive Patient Education  2017 [place] Prevention in the Home Falls can cause injuries. They can happen to people of all ages. There are many things you can do to make your home safe and to help prevent falls. What can I do on the outside of my home? Regularly fix the edges of walkways and driveways and fix any cracks. Remove anything that might make you trip as you walk through a door, such as a raised step or threshold. Trim any bushes or trees on the path to your home. Use bright outdoor lighting. Clear any walking paths of anything that might make someone trip, such as rocks or tools. Regularly check to see if handrails are loose or broken. Make sure that both sides of any steps have handrails. Any raised decks and porches should have guardrails on the edges. Have any leaves, snow, or ice cleared regularly. Use sand or salt on walking paths during winter. Clean up any spills in your garage right away. This includes oil or grease spills. What can I do in the bathroom? Use night lights. Install grab bars by the toilet and in the tub and shower. Do not use towel bars as grab bars. Use non-skid mats or decals in the tub or shower. If you need to sit down in the shower, use a plastic, non-slip stool. Keep the floor dry. Clean up any water that spills on the floor as soon as it happens. Remove soap buildup in the tub or shower regularly. Attach bath mats securely with double-sided non-slip rug tape. Do not have throw  rugs and other things on the floor that can make you trip. What can I do in the bedroom? Use night lights. Make sure that you have a light by your bed that is easy to reach. Do not use any sheets or blankets that are too big for your bed. They should not hang down onto the floor. Have a firm chair that has side arms. You can use this for support while you get dressed. Do not have throw rugs and other things on the floor that can make you trip. What can I do in the kitchen? Clean up any spills right away. Avoid walking on wet floors. Keep items that you use a lot in easy-to-reach places. If you need to reach something above you, use a strong step stool that has a grab bar. Keep electrical cords out of the way. Do not use floor polish or wax that makes floors slippery. If you must use wax, use non-skid floor wax. Do not have throw rugs and other things on the floor that can make you trip. What can I do with my stairs? Do not leave any items on the stairs. Make sure  that there are handrails on both sides of the stairs and use them. Fix handrails that are broken or loose. Make sure that handrails are as long as the stairways. Check any carpeting to make sure that it is firmly attached to the stairs. Fix any carpet that is loose or worn. Avoid having throw rugs at the top or bottom of the stairs. If you do have throw rugs, attach them to the floor with carpet tape. Make sure that you have a light switch at the top of the stairs and the bottom of the stairs. If you do not have them, ask someone to add them for you. What else can I do to help prevent falls? Wear shoes that: Do not have high heels. Have rubber bottoms. Are comfortable and fit you well. Are closed at the toe. Do not wear sandals. If you use a stepladder: Make sure that it is fully opened. Do not climb a closed stepladder. Make sure that both sides of the stepladder are locked into place. Ask someone to hold it for you, if  possible. Clearly mark and make sure that you can see: Any grab bars or handrails. First and last steps. Where the edge of each step is. Use tools that help you move around (mobility aids) if they are needed. These include: Canes. Walkers. Scooters. Crutches. Turn on the lights when you go into a dark area. Replace any light bulbs as soon as they burn out. Set up your furniture so you have a clear path. Avoid moving your furniture around. If any of your floors are uneven, fix them. If there are any pets around you, be aware of where they are. Review your medicines with your doctor. Some medicines can make you feel dizzy. This can increase your chance of falling. Ask your doctor what other things that you can do to help prevent falls. This information is not intended to replace advice given to you by your health care provider. Make sure you discuss any questions you have with your health care provider. Document Released: 11/15/2008 Document Revised: 06/27/2015 Document Reviewed: 02/23/2014 Elsevier Interactive Patient Education  2017 [employer].

## 2021-10-30 ENCOUNTER — Other Ambulatory Visit: Payer: Self-pay | Admitting: Family Medicine

## 2021-11-11 DIAGNOSIS — G4761 Periodic limb movement disorder: Secondary | ICD-10-CM | POA: Diagnosis not present

## 2021-11-11 DIAGNOSIS — G4733 Obstructive sleep apnea (adult) (pediatric): Secondary | ICD-10-CM | POA: Diagnosis not present

## 2021-11-11 DIAGNOSIS — E669 Obesity, unspecified: Secondary | ICD-10-CM | POA: Diagnosis not present

## 2021-11-11 DIAGNOSIS — I10 Essential (primary) hypertension: Secondary | ICD-10-CM | POA: Diagnosis not present

## 2021-12-30 ENCOUNTER — Other Ambulatory Visit: Payer: Self-pay | Admitting: Family Medicine

## 2022-01-13 ENCOUNTER — Other Ambulatory Visit: Payer: Self-pay | Admitting: Family Medicine

## 2022-01-13 DIAGNOSIS — I10 Essential (primary) hypertension: Secondary | ICD-10-CM

## 2022-02-09 ENCOUNTER — Ambulatory Visit (INDEPENDENT_AMBULATORY_CARE_PROVIDER_SITE_OTHER): Payer: Medicare PPO | Admitting: Family Medicine

## 2022-02-09 ENCOUNTER — Encounter: Payer: Self-pay | Admitting: Family Medicine

## 2022-02-09 VITALS — BP 112/64 | HR 61 | Temp 97.5°F | Resp 16 | Ht 66.0 in | Wt 200.6 lb

## 2022-02-09 DIAGNOSIS — I700 Atherosclerosis of aorta: Secondary | ICD-10-CM | POA: Diagnosis not present

## 2022-02-09 DIAGNOSIS — Z0000 Encounter for general adult medical examination without abnormal findings: Secondary | ICD-10-CM | POA: Diagnosis not present

## 2022-02-09 DIAGNOSIS — M545 Low back pain, unspecified: Secondary | ICD-10-CM | POA: Diagnosis not present

## 2022-02-09 DIAGNOSIS — I10 Essential (primary) hypertension: Secondary | ICD-10-CM

## 2022-02-09 LAB — COMPREHENSIVE METABOLIC PANEL
ALT: 31 U/L (ref 0–53)
AST: 28 U/L (ref 0–37)
Albumin: 4.4 g/dL (ref 3.5–5.2)
Alkaline Phosphatase: 120 U/L — ABNORMAL HIGH (ref 39–117)
BUN: 16 mg/dL (ref 6–23)
CO2: 30 mEq/L (ref 19–32)
Calcium: 9.2 mg/dL (ref 8.4–10.5)
Chloride: 103 mEq/L (ref 96–112)
Creatinine, Ser: 0.95 mg/dL (ref 0.40–1.50)
GFR: 81.76 mL/min (ref >60.00)
Glucose, Bld: 92 mg/dL (ref 70–99)
Potassium: 4.2 mEq/L (ref 3.5–5.1)
Sodium: 140 mEq/L (ref 135–145)
Total Bilirubin: 0.9 mg/dL (ref 0.2–1.2)
Total Protein: 6.8 g/dL (ref 6.0–8.3)

## 2022-02-09 LAB — LIPID PANEL
Cholesterol: 103 mg/dL (ref 0–200)
HDL: 35.5 mg/dL — ABNORMAL LOW (ref >39.00)
LDL Cholesterol: 52 mg/dL (ref 0–99)
NonHDL: 67.86
Total CHOL/HDL Ratio: 3
Triglycerides: 80 mg/dL (ref 0.0–149.0)
VLDL: 16 mg/dL (ref 0.0–40.0)

## 2022-02-09 LAB — CBC
HCT: 44.3 % (ref 39.0–52.0)
Hemoglobin: 15.2 g/dL (ref 13.0–17.0)
MCHC: 34.3 g/dL (ref 30.0–36.0)
MCV: 91.1 fl (ref 78.0–100.0)
Platelets: 111 10*3/uL — ABNORMAL LOW (ref 150.0–400.0)
RBC: 4.86 Mil/uL (ref 4.22–5.81)
RDW: 13.7 % (ref 11.5–15.5)
WBC: 9.1 10*3/uL (ref 4.0–10.5)

## 2022-02-09 MED ORDER — TIZANIDINE HCL 4 MG PO TABS: 4.0000 mg | ORAL_TABLET | Freq: Four times a day (QID) | ORAL | 0 refills | Status: DC | PRN

## 2022-02-09 NOTE — Progress Notes (Signed)
Chief Complaint  Patient presents with   Annual Exam    Back pain     Well Male Cameron Woods is here for a complete physical.   His last physical was >1 year ago.  Current diet: in general, a "healthy" diet.   Current exercise: walking, active in yard Weight trend: intentionally losing Fatigue out of ordinary? No. Seat belt? Yes.   Advanced directive? Yes  Health maintenance Shingrix- No Colonoscopy- Yes Tetanus- Yes Hep C- Yes Pneumonia vaccine- Yes AAA screening- Yes  LBP R lower back pain that comes and goes. 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 Has been bothering him for the past month or so. Not getting better. Described as an aching pain. No radiation. Has done well w muscle relaxers in the past. Has not tried anything lately.   Past Medical History:  Diagnosis Date   Aortic atherosclerosis (HCC)    GERD (gastroesophageal reflux disease)    Hyperlipidemia    Hypertension    NAFLD (nonalcoholic fatty liver disease)    Sleep apnea      Past Surgical History:  Procedure Laterality Date   COLONOSCOPY     TONSILLECTOMY     UPPER GASTROINTESTINAL ENDOSCOPY      Medications  Current Outpatient Medications on File Prior to Visit  Medication Sig Dispense Refill   amLODipine (NORVASC) 10 MG tablet TAKE 1 TABLET BY MOUTH EVERY DAY 30 tablet 3   aspirin 81 MG EC tablet Take 1 tablet (81 mg total) by mouth daily. Swallow whole. 30 tablet 12   clonazePAM (KLONOPIN) 0.5 MG tablet Take 0.5 mg by mouth.     famotidine (PEPCID) 20 MG tablet Take 1 tablet (20 mg total) by mouth at bedtime. 30 tablet 0   ferrous sulfate 325 (65 FE) MG EC tablet Take 325 mg by mouth 3 (three) times daily with meals.     nadolol (CORGARD) 20 MG tablet Take 1 tablet (20 mg total) by mouth daily. 90 tablet 3   olmesartan (BENICAR) 20 MG tablet Take 1 tablet (20 mg total) by mouth daily. 90 tablet 0   pantoprazole (PROTONIX) 40 MG tablet TAKE 1 TABLET BY MOUTH 2 TIMES A DAY  FORACID 60 tablet 3   rosuvastatin (CRESTOR) 20 MG tablet TAKE 1 TABLET BY MOUTH EVERY DAY FOR CHOLESTEROL 90 tablet 1    Allergies No Known Allergies  Family History Family History  Problem Relation Age of Onset   Stroke Mother    Cancer Father        prostate cancer   Hypertension Sister    Cancer Brother        prostate cancer   Colon cancer Neg Hx    Esophageal cancer Neg Hx    Rectal cancer Neg Hx    Stomach cancer Neg Hx     Review of Systems: Constitutional:  no fevers Eye:  no recent significant change in vision Ears:  No changes in hearing Nose/Mouth/Throat:  no complaints of nasal congestion, no sore throat Cardiovascular: no chest pain Respiratory:  No shortness of breath Gastrointestinal:  No change in bowel habits GU:  No frequency Integumentary:  no abnormal skin lesions reported Neurologic:  no headaches Endocrine:  denies unexplained weight changes  Exam BP 112/64   Pulse 61   Temp (!) 97.5 F (36.4 C)   Resp 16   Ht 5\' 6"  (1.676 m)   Wt 200 lb 9.6 oz (91 kg)   SpO2  96%   BMI 32.38 kg/m  General:  well developed, well nourished, in no apparent distress Skin:  no significant moles, warts, or growths Head:  no masses, lesions, or tenderness Eyes:  pupils equal and round, sclera anicteric without injection Ears:  canals without lesions, TMs shiny without retraction, no obvious effusion, no erythema Nose:  nares patent, mucosa normal Throat/Pharynx:  lips and gingiva without lesion; tongue and uvula midline; non-inflamed pharynx; no exudates or postnasal drainage Lungs:  clear to auscultation, breath sounds equal bilaterally, no respiratory distress Cardio:  regular rate and rhythm, no LE edema or bruits Rectal: Deferred GI: BS+, S, NT, ND, no masses or organomegaly Musculoskeletal: mild ttp over the R lumbar parasp msc; poor hamstring ROM; symmetrical muscle groups noted without atrophy or deformity Neuro:  neg straight leg, gait normal; deep  tendon reflexes normal and symmetric Psych: well oriented with normal range of affect and appropriate judgment/insight  Assessment and Plan  Well adult exam  Aortic atherosclerosis (HCC) - Plan: Comprehensive metabolic panel, Lipid panel  Essential hypertension - Plan: CBC  Acute right-sided low back pain without sciatica - Plan: tiZANidine (ZANAFLEX) 4 MG tablet   Well 72 y.o. male. Counseled on diet and exercise. Advanced directive form requested today.  Back pain: Heat, ice, Tylenol, stretches/exercises, Zanaflex. Take Zanaflex 2 hrs before bedtime to make sure he is not in minority of ppl who get drowsy from it.  Other orders as above. Follow up in 6 mo.  The patient voiced understanding and agreement to the plan.  [place], DO 02/09/22 1:02 PM

## 2022-02-09 NOTE — Patient Instructions (Addendum)
Give [place] 2-3 business days to get the results of your labs back.   Keep the diet clean and stay active. Consider weight resistance exercise as part of your routine.   Please get me a copy of your advanced directive form at your convenience.   OK to take Tylenol 1000 mg (2 extra strength tabs) or 975 mg (3 regular strength tabs) every 6 hours as needed.  Heat (pad or rice pillow in microwave) over affected area, 10-15 minutes twice daily.   Ice/cold pack over area for 10-15 min twice daily.  Let us know if you need anything.  EXERCISES  RANGE OF MOTION (ROM) AND STRETCHING EXERCISES - Low Back Pain Most people with lower back pain will find that their symptoms get worse with excessive bending forward (flexion) or arching at the lower back (extension). The exercises that will help resolve your symptoms will focus on the opposite motion.  If you have pain, numbness or tingling which travels down into your buttocks, leg or foot, the goal of the therapy is for these symptoms to move closer to your back and eventually resolve. Sometimes, these leg symptoms will get better, but your lower back pain may worsen. This is often an indication of progress in your rehabilitation. Be very alert to any changes in your symptoms and the activities in which you participated in the 24 hours prior to the change. Sharing this information with your caregiver will allow him or her to most efficiently treat your condition. These exercises may help you when beginning to rehabilitate your injury. Your symptoms may resolve with or without further involvement from your physician, physical therapist or athletic trainer. While completing these exercises, remember:  Restoring tissue flexibility helps normal motion to return to the joints. This allows healthier, less painful movement and activity. An effective stretch should be held for at least 30 seconds. A stretch should never be painful. You should only feel a gentle  lengthening or release in the stretched tissue. FLEXION RANGE OF MOTION AND STRETCHING EXERCISES:  STRETCH - Flexion, Single Knee to Chest  Lie on a firm bed or floor with both legs extended in front of you. Keeping one leg in contact with the floor, bring your opposite knee to your chest. Hold your leg in place by either grabbing behind your thigh or at your knee. Pull until you feel a gentle stretch in your low back. Hold 30 seconds. Slowly release your grasp and repeat the exercise with the opposite side. Repeat 2 times. Complete this exercise 3 times per week.   STRETCH - Flexion, Double Knee to Chest Lie on a firm bed or floor with both legs extended in front of you. Keeping one leg in contact with the floor, bring your opposite knee to your chest. Tense your stomach muscles to support your back and then lift your other knee to your chest. Hold your legs in place by either grabbing behind your thighs or at your knees. Pull both knees toward your chest until you feel a gentle stretch in your low back. Hold 30 seconds. Tense your stomach muscles and slowly return one leg at a time to the floor. Repeat 2 times. Complete this exercise 3 times per week.   STRETCH - Low Trunk Rotation Lie on a firm bed or floor. Keeping your legs in front of you, bend your knees so they are both pointed toward the ceiling and your feet are flat on the floor. Extend your arms out to the side.  This will stabilize your upper body by keeping your shoulders in contact with the floor. Gently and slowly drop both knees together to one side until you feel a gentle stretch in your low back. Hold for 30 seconds. Tense your stomach muscles to support your lower back as you bring your knees back to the starting position. Repeat the exercise to the other side. Repeat 2 times. Complete this exercise at least 3 times per week.   EXTENSION RANGE OF MOTION AND FLEXIBILITY EXERCISES:  STRETCH - Extension, Prone on Elbows   Lie on your stomach on the floor, a bed will be too soft. Place your palms about shoulder width apart and at the height of your head. Place your elbows under your shoulders. If this is too painful, stack pillows under your chest. Allow your body to relax so that your hips drop lower and make contact more completely with the floor. Hold this position for 30 seconds. Slowly return to lying flat on the floor. Repeat 2 times. Complete this exercise 3 times per week.   RANGE OF MOTION - Extension, Prone Press Ups Lie on your stomach on the floor, a bed will be too soft. Place your palms about shoulder width apart and at the height of your head. Keeping your back as relaxed as possible, slowly straighten your elbows while keeping your hips on the floor. You may adjust the placement of your hands to maximize your comfort. As you gain motion, your hands will come more underneath your shoulders. Hold this position 30 seconds. Slowly return to lying flat on the floor. Repeat 2 times. Complete this exercise 3 times per week.   RANGE OF MOTION- Quadruped, Neutral Spine  Assume a hands and knees position on a firm surface. Keep your hands under your shoulders and your knees under your hips. You may place padding under your knees for comfort. Drop your head and point your tailbone toward the ground below you. This will round out your lower back like an angry cat. Hold this position for 30 seconds. Slowly lift your head and release your tail bone so that your back sags into a large arch, like an old horse. Hold this position for 30 seconds. Repeat this until you feel limber in your low back. Now, find your "sweet spot." This will be the most comfortable position somewhere between the two previous positions. This is your neutral spine. Once you have found this position, tense your stomach muscles to support your low back. Hold this position for 30 seconds. Repeat 2 times. Complete this exercise 3 times per  week.   STRENGTHENING EXERCISES - Low Back Sprain These exercises may help you when beginning to rehabilitate your injury. These exercises should be done near your "sweet spot." This is the neutral, low-back arch, somewhere between fully rounded and fully arched, that is your least painful position. When performed in this safe range of motion, these exercises can be used for people who have either a flexion or extension based injury. These exercises may resolve your symptoms with or without further involvement from your physician, physical therapist or athletic trainer. While completing these exercises, remember:  Muscles can gain both the endurance and the strength needed for everyday activities through controlled exercises. Complete these exercises as instructed by your physician, physical therapist or athletic trainer. Increase the resistance and repetitions only as guided. You may experience muscle soreness or fatigue, but the pain or discomfort you are trying to eliminate should never worsen during these  exercises. If this pain does worsen, stop and make certain you are following the directions exactly. If the pain is still present after adjustments, discontinue the exercise until you can discuss the trouble with your caregiver.  STRENGTHENING - Deep Abdominals, Pelvic Tilt  Lie on a firm bed or floor. Keeping your legs in front of you, bend your knees so they are both pointed toward the ceiling and your feet are flat on the floor. Tense your lower abdominal muscles to press your low back into the floor. This motion will rotate your pelvis so that your tail bone is scooping upwards rather than pointing at your feet or into the floor. With a gentle tension and even breathing, hold this position for 3 seconds. Repeat 2 times. Complete this exercise 3 times per week.   STRENGTHENING - Abdominals, Crunches  Lie on a firm bed or floor. Keeping your legs in front of you, bend your knees so they are  both pointed toward the ceiling and your feet are flat on the floor. Cross your arms over your chest. Slightly tip your chin down without bending your neck. Tense your abdominals and slowly lift your trunk high enough to just clear your shoulder blades. Lifting higher can put excessive stress on the lower back and does not further strengthen your abdominal muscles. Control your return to the starting position. Repeat 2 times. Complete this exercise 3 times per week.   STRENGTHENING - Quadruped, Opposite UE/LE Lift  Assume a hands and knees position on a firm surface. Keep your hands under your shoulders and your knees under your hips. You may place padding under your knees for comfort. Find your neutral spine and gently tense your abdominal muscles so that you can maintain this position. Your shoulders and hips should form a rectangle that is parallel with the floor and is not twisted. Keeping your trunk steady, lift your right hand no higher than your shoulder and then your left leg no higher than your hip. Make sure you are not holding your breath. Hold this position for 30 seconds. Continuing to keep your abdominal muscles tense and your back steady, slowly return to your starting position. Repeat with the opposite arm and leg. Repeat 2 times. Complete this exercise 3 times per week.   STRENGTHENING - Abdominals and Quadriceps, Straight Leg Raise  Lie on a firm bed or floor with both legs extended in front of you. Keeping one leg in contact with the floor, bend the other knee so that your foot can rest flat on the floor. Find your neutral spine, and tense your abdominal muscles to maintain your spinal position throughout the exercise. Slowly lift your straight leg off the floor about 6 inches for a count of 3, making sure to not hold your breath. Still keeping your neutral spine, slowly lower your leg all the way to the floor. Repeat this exercise with each leg 2 times. Complete this exercise 3  times per week.  POSTURE AND BODY MECHANICS CONSIDERATIONS - Low Back Sprain Keeping correct posture when sitting, standing or completing your activities will reduce the stress put on different body tissues, allowing injured tissues a chance to heal and limiting painful experiences. The following are general guidelines for improved posture.  While reading these guidelines, remember: The exercises prescribed by your provider will help you have the flexibility and strength to maintain correct postures. The correct posture provides the best environment for your joints to work. All of your joints have less wear  and tear when properly supported by a spine with good posture. This means you will experience a healthier, less painful body. Correct posture must be practiced with all of your activities, especially prolonged sitting and standing. Correct posture is as important when doing repetitive low-stress activities (typing) as it is when doing a single heavy-load activity (lifting).  RESTING POSITIONS Consider which positions are most painful for you when choosing a resting position. If you have pain with flexion-based activities (sitting, bending, stooping, squatting), choose a position that allows you to rest in a less flexed posture. You would want to avoid curling into a fetal position on your side. If your pain worsens with extension-based activities (prolonged standing, working overhead), avoid resting in an extended position such as sleeping on your stomach. Most people will find more comfort when they rest with their spine in a more neutral position, neither too rounded nor too arched. Lying on a non-sagging bed on your side with a pillow between your knees, or on your back with a pillow under your knees will often provide some relief. Keep in mind, being in any one position for a prolonged period of time, no matter how correct your posture, can still lead to stiffness.  PROPER SITTING POSTURE In order  to minimize stress and discomfort on your spine, you must sit with correct posture. Sitting with good posture should be effortless for a healthy body. Returning to good posture is a gradual process. Many people can work toward this most comfortably by using various supports until they have the flexibility and strength to maintain this posture on their own. When sitting with proper posture, your ears will fall over your shoulders and your shoulders will fall over your hips. You should use the back of the chair to support your upper back. Your lower back will be in a neutral position, just slightly arched. You may place a small pillow or folded towel at the base of your lower back for  support.  When working at a desk, create an environment that supports good, upright posture. Without extra support, muscles tire, which leads to excessive strain on joints and other tissues. Keep these recommendations in mind:  CHAIR: A chair should be able to slide under your desk when your back makes contact with the back of the chair. This allows you to work closely. The chair's height should allow your eyes to be level with the upper part of your monitor and your hands to be slightly lower than your elbows.  BODY POSITION Your feet should make contact with the floor. If this is not possible, use a foot rest. Keep your ears over your shoulders. This will reduce stress on your neck and low back.  INCORRECT SITTING POSTURES  If you are feeling tired and unable to assume a healthy sitting posture, do not slouch or slump. This puts excessive strain on your back tissues, causing more damage and pain. Healthier options include: Using more support, like a lumbar pillow. Switching tasks to something that requires you to be upright or walking. Talking a brief walk. Lying down to rest in a neutral-spine position.  PROLONGED STANDING WHILE SLIGHTLY LEANING FORWARD  When completing a task that requires you to lean forward  while standing in one place for a long time, place either foot up on a stationary 2-4 inch high object to help maintain the best posture. When both feet are on the ground, the lower back tends to lose its slight inward curve. If this  curve flattens (or becomes too large), then the back and your other joints will experience too much stress, tire more quickly, and can cause pain.  CORRECT STANDING POSTURES Proper standing posture should be assumed with all daily activities, even if they only take a few moments, like when brushing your teeth. As in sitting, your ears should fall over your shoulders and your shoulders should fall over your hips. You should keep a slight tension in your abdominal muscles to brace your spine. Your tailbone should point down to the ground, not behind your body, resulting in an over-extended swayback posture.   INCORRECT STANDING POSTURES  Common incorrect standing postures include a forward head, locked knees and/or an excessive swayback. WALKING Walk with an upright posture. Your ears, shoulders and hips should all line-up.  PROLONGED ACTIVITY IN A FLEXED POSITION When completing a task that requires you to bend forward at your waist or lean over a low surface, try to find a way to stabilize 3 out of 4 of your limbs. You can place a hand or elbow on your thigh or rest a knee on the surface you are reaching across. This will provide you more stability, so that your muscles do not tire as quickly. By keeping your knees relaxed, or slightly bent, you will also reduce stress across your lower back. CORRECT LIFTING TECHNIQUES  DO : Assume a wide stance. This will provide you more stability and the opportunity to get as close as possible to the object which you are lifting. Tense your abdominals to brace your spine. Bend at the knees and hips. Keeping your back locked in a neutral-spine position, lift using your leg muscles. Lift with your legs, keeping your back straight. Test  the weight of unknown objects before attempting to lift them. Try to keep your elbows locked down at your sides in order get the best strength from your shoulders when carrying an object.   Always ask for help when lifting heavy or awkward objects. INCORRECT LIFTING TECHNIQUES DO NOT:  Lock your knees when lifting, even if it is a small object. Bend and twist. Pivot at your feet or move your feet when needing to change directions. Assume that you can safely pick up even a paperclip without proper posture.

## 2022-03-27 ENCOUNTER — Ambulatory Visit: Payer: Medicare PPO | Admitting: Internal Medicine

## 2022-03-27 ENCOUNTER — Encounter: Payer: Self-pay | Admitting: Internal Medicine

## 2022-03-27 VITALS — BP 122/62 | HR 62 | Ht 66.0 in | Wt 201.0 lb

## 2022-03-27 DIAGNOSIS — I8500 Esophageal varices without bleeding: Secondary | ICD-10-CM | POA: Diagnosis not present

## 2022-03-27 DIAGNOSIS — K7030 Alcoholic cirrhosis of liver without ascites: Secondary | ICD-10-CM | POA: Diagnosis not present

## 2022-03-27 MED ORDER — NADOLOL 20 MG PO TABS: 20.0000 mg | ORAL_TABLET | Freq: Every day | ORAL | 3 refills | Status: DC

## 2022-03-27 NOTE — Progress Notes (Unsigned)
Review of pertinent gastrointestinal problems: 1. Cirrhosis, likely from previous alcohol abuse. Ultrasound 05/2021: cirrhosis without mass lesions MELD-Na 7 (05/2021 labs) EGD 05/2021 small esophageal varices, mild portal gastropathy, multiple hyperplastic polyps throughout the stomach biopsy-proven. Blood work 05/2021: Immune to hepatitis A, normal TTG, ceruloplasmin normal, antimitochondrial antibody negative, alpha-1 antitrypsin level normal, ANA negative, ferritin normal, hepatitis C antibody negative, hepatitis B surface antibody nonreactive, hepatitis B surface antigen nonreactive, Alk phos chronically elevated around 1 30-200, AST intermittently elevated up to around 55. Last alcoholic beverage April [id number]  2.  History of irregular Z-line, very short segment Barrett's (without dysplasia) however this was not seen on EGD 05/2021.  EGD 12/2014 Dr. Michail Sermon indication "esophageal reflux, Barrett's esophagus."  Findings 1 cm of Barrett's appearing mucosa just above the GE junction.  Gastritis.  Esophagus was biopsied and this showed "intestinal metaplasia consistent with Barrett's esophagus."  Also reflux damage.  EGD 03/2018 Dr. Michail Sermon indication "esophageal reflux, Barrett's esophagus."  Findings exactly the same as the previous findings, a very short segment Barrett's appearing mucosa at the GE junction.  Pathology "Barrett's mucosa.  Negative for dysplasia.  (1 fragment of tissue)"  3.  Colonoscopy 12/12/2014 Dr. Michail Sermon indication "personal history of colonic polyps, last colonoscopy 2007.  Hemorrhoids were noted.  The examination was otherwise normal.  He was recommended to have repeat colonoscopy at 5-year interval however I am not sure why that is the case as I do not have any previous records from [facility] gastroenterology.  He is in for recall colonoscopy 12/2024  HPI: 72 year old male with history of alcoholic cirrhosis, GERD, and OSA presents for follow up of alcoholic cirrhosis  Interval  History: Denies hematochezia, melena, confusion, BLE edema, and abdominal distension. Occasionally he will be forgetful, but this is not excessively different from his baseline. He has not had a beer in about 10 months. He is looking forward to becoming a great-grandfather. He has been eating well. Bowel habits have been regular.   Current Outpatient Medications  Medication Instructions   amLODipine (NORVASC) 10 MG tablet TAKE 1 TABLET BY MOUTH EVERY DAY   aspirin EC 81 mg, Oral, Daily, Swallow whole.   clonazePAM (KLONOPIN) 0.5 mg, Oral   famotidine (PEPCID) 20 mg, Oral, Daily at bedtime   ferrous sulfate 325 mg, Oral, 3 times daily with meals   nadolol (CORGARD) 20 mg, Oral, Daily   olmesartan (BENICAR) 20 mg, Oral, Daily   pantoprazole (PROTONIX) 40 MG tablet TAKE 1 TABLET BY MOUTH 2 TIMES A DAY FORACID   rosuvastatin (CRESTOR) 20 MG tablet TAKE 1 TABLET BY MOUTH EVERY DAY FOR CHOLESTEROL   tiZANidine (ZANAFLEX) 4 mg, Oral, Every 6 hours PRN   Physical Exam: BP 122/62   Pulse 62   Ht '5\' 6"'$  (1.676 m)   Wt 201 lb (91.2 kg)   BMI 32.44 kg/m  Constitutional: generally well-appearing Psychiatric: alert and oriented Cardiovascular: regular rate Resp: no increased WOB Abdomen: soft, nontender, nondistended, no obvious ascites, no peritoneal signs, normal bowel sounds Extremities: No peripheral edema noted in lower extremities Neuro: No asterixis  Labs 02/2022: CBC with low plts of 111. CMP with mildly elevated alk phos of 120.  Ab U/S 05/08/21: IMPRESSION: Nodularity in the liver surface suggests cirrhosis.  Splenomegaly. Abdominal sonogram is otherwise unremarkable.  EGD 05/27/21: - Two trunks of small esophageal varices without signs of recent bleeding. - Mild inflammation characterized by erythema and friability was found in the gastric antrum. Biopsies were taken with a cold  forceps for histology. - Numerous (25-30) soft, fleshy, friable gastric polyps in the proximal 2/3 of the  stomach. These ranged in size from 53m to 2cm. They are likely hyperplastic polyps, I sampled several with forceps. - Changes of mild portal gastropathy throughout the proximal stomach. No gastric varices 1. Surgical [P], gastric antrum REACTIVE GASTROPATHY NEGATIVE FOR H. PYLORI, INTESTINAL METAPLASIA, DYSPLASIA AND CARCINOMA 2. Surgical [P], gastric polyps COMPATIBLE WITH ULCERATED HYPERPLASTIC POLYP REACTIVE GASTROPATHY WITH FOVEOLAR HYPERPLASIA AND EROSION/ULCER WITH ULCER SLOUGH NEGATIVE FOR H. PYLORI, INTESTINAL METAPLASIA, DYSPLASIA AND CARCINOMA  MELD 3.0: 8 at 07/22/2021  4:19 PM MELD-Na: 8 at 07/22/2021  4:19 PM Calculated from: Serum Creatinine: 0.93 mg/dL (Using min of 1 mg/dL) at 07/22/2021  4:19 PM Serum Sodium: 136 mEq/L at 07/22/2021  4:19 PM Total Bilirubin: 0.7 mg/dL (Using min of 1 mg/dL) at 07/22/2021  4:19 PM Serum Albumin: 4.2 g/dL (Using max of 3.5 g/dL) at 07/22/2021  4:19 PM INR(ratio): 1.2 ratio at 07/22/2021  4:19 PM Age at listing (hypothetical): 89years Sex: Male at 07/22/2021  4:19 PM  Assessment and plan: EtOH cirrhosis History of small esophageal varices Thrombocytopenia History of hyperplastic gastric polyp Patient presents with cirrhosis that is well compensated at this time. He has tolerated nadolol therapy well. Will get him updated with his HWiotascreening.  - Cont nadolol 20 mg QD. Refill - Will plan for abdominal U/S for HCC screening - RTC 6 months  CChristia Reading MD [facility] 03/27/2022, 3:44 PM  Total time on date of encounter was 35 minutes (this included time spent preparing to see the patient reviewing records; obtaining and/or reviewing separately obtained history; performing a medically appropriate exam and/or evaluation; counseling and educating the patient and family if present; ordering medications, tests or procedures if applicable; and documenting clinical information in the health record).

## 2022-03-27 NOTE — Patient Instructions (Addendum)
_______________________________________________________  If your blood pressure at your visit was 140/90 or greater, please contact your primary care physician to follow up on this.  _______________________________________________________  If you are age 70 or older, your body mass index should be between 23-30. Your Body mass index is 32.44 kg/m. If this is out of the aforementioned range listed, please consider follow up with your Primary Care Provider.  If you are age 26 or younger, your body mass index should be between 19-25. Your Body mass index is 32.44 kg/m. If this is out of the aformentioned range listed, please consider follow up with your Primary Care Provider.   ________________________________________________________  The [place] [facility] providers would like to encourage you to use [facility] to communicate with providers for non-urgent requests or questions.  Due to long hold times on the telephone, sending your provider a message by [facility] may be a faster and more efficient way to get a response.  Please allow 48 business hours for a response.  Please remember that this is for non-urgent requests.  _______________________________________________________  Cameron Woods have been scheduled for an abdominal ultrasound at [facility] Radiology (1st floor of hospital) on 04/10/22 at 9:00 am Arrive at 8:45 AM . Please arrive 30 minutes prior to your appointment for registration. Make certain not to have anything to eat or drink 6 hours prior to your appointment. Should you need to reschedule your appointment, please contact radiology at [phone]. This test typically takes about 30 minutes to perform.  We have sent the following medications to your pharmacy for you to pick up at your convenience: Nadolol  Follow up in 6 months

## 2022-03-28 MED ORDER — NADOLOL 20 MG PO TABS: 20.0000 mg | ORAL_TABLET | Freq: Every day | ORAL | 3 refills | Status: DC

## 2022-04-09 ENCOUNTER — Encounter: Payer: Self-pay | Admitting: Radiology

## 2022-04-10 ENCOUNTER — Ambulatory Visit (HOSPITAL_COMMUNITY)
Admission: RE | Admit: 2022-04-10 | Discharge: 2022-04-10 | Disposition: A | Discharge status: Home / Self Care | Payer: Medicare PPO | Source: Ambulatory Visit | Attending: Internal Medicine | Admitting: Internal Medicine

## 2022-04-10 DIAGNOSIS — I8500 Esophageal varices without bleeding: Secondary | ICD-10-CM | POA: Insufficient documentation

## 2022-04-10 DIAGNOSIS — K7030 Alcoholic cirrhosis of liver without ascites: Secondary | ICD-10-CM | POA: Diagnosis not present

## 2022-04-10 DIAGNOSIS — K7460 Unspecified cirrhosis of liver: Secondary | ICD-10-CM | POA: Diagnosis not present

## 2022-06-01 ENCOUNTER — Other Ambulatory Visit: Payer: Self-pay | Admitting: Family Medicine

## 2022-08-10 ENCOUNTER — Encounter: Payer: Self-pay | Admitting: Family Medicine

## 2022-08-10 ENCOUNTER — Ambulatory Visit: Payer: Medicare PPO | Admitting: Family Medicine

## 2022-08-10 VITALS — BP 120/70 | HR 50 | Temp 98.6°F | Ht 65.0 in | Wt 192.2 lb

## 2022-08-10 DIAGNOSIS — M545 Low back pain, unspecified: Secondary | ICD-10-CM

## 2022-08-10 DIAGNOSIS — Z125 Encounter for screening for malignant neoplasm of prostate: Secondary | ICD-10-CM | POA: Diagnosis not present

## 2022-08-10 DIAGNOSIS — E785 Hyperlipidemia, unspecified: Secondary | ICD-10-CM | POA: Diagnosis not present

## 2022-08-10 DIAGNOSIS — I10 Essential (primary) hypertension: Secondary | ICD-10-CM | POA: Diagnosis not present

## 2022-08-10 LAB — LIPID PANEL
Cholesterol: 102 mg/dL (ref 0–200)
HDL: 32.2 mg/dL — ABNORMAL LOW (ref >39.00)
LDL Cholesterol: 50 mg/dL (ref 0–99)
NonHDL: 69.82
Total CHOL/HDL Ratio: 3
Triglycerides: 97 mg/dL (ref 0.0–149.0)
VLDL: 19.4 mg/dL (ref 0.0–40.0)

## 2022-08-10 LAB — COMPREHENSIVE METABOLIC PANEL
ALT: 16 U/L (ref 0–53)
AST: 23 U/L (ref 0–37)
Albumin: 4.2 g/dL (ref 3.5–5.2)
Alkaline Phosphatase: 90 U/L (ref 39–117)
BUN: 16 mg/dL (ref 6–23)
CO2: 26 mEq/L (ref 19–32)
Calcium: 9.2 mg/dL (ref 8.4–10.5)
Chloride: 105 mEq/L (ref 96–112)
Creatinine, Ser: 0.98 mg/dL (ref 0.40–1.50)
GFR: 78.5 mL/min (ref >60.00)
Glucose, Bld: 93 mg/dL (ref 70–99)
Potassium: 4.6 mEq/L (ref 3.5–5.1)
Sodium: 139 mEq/L (ref 135–145)
Total Bilirubin: 1 mg/dL (ref 0.2–1.2)
Total Protein: 6.6 g/dL (ref 6.0–8.3)

## 2022-08-10 LAB — PSA: PSA: 2.32 ng/mL (ref 0.10–4.00)

## 2022-08-10 NOTE — Patient Instructions (Addendum)
Give [place] 2-3 business days to get the results of your labs back.   Keep the diet clean and stay active.  If the top number of your blood pressure ever gets below 110, I would like to know please.  Send me a message in a month if no better with the back/hip area.   Heat (pad or rice pillow in microwave) over affected area, 10-15 minutes twice daily.   Ice/cold pack over area for 10-15 min twice daily.  OK to take Tylenol 1000 mg (2 extra strength tabs) or 975 mg (3 regular strength tabs) every 6 hours as needed.   Let us know if you need anything.  EXERCISES  RANGE OF MOTION (ROM) AND STRETCHING EXERCISES - Low Back Pain Most people with lower back pain will find that their symptoms get worse with excessive bending forward (flexion) or arching at the lower back (extension). The exercises that will help resolve your symptoms will focus on the opposite motion.  If you have pain, numbness or tingling which travels down into your buttocks, leg or foot, the goal of the therapy is for these symptoms to move closer to your back and eventually resolve. Sometimes, these leg symptoms will get better, but your lower back pain may worsen. This is often an indication of progress in your rehabilitation. Be very alert to any changes in your symptoms and the activities in which you participated in the 24 hours prior to the change. Sharing this information with your caregiver will allow him or her to most efficiently treat your condition. These exercises may help you when beginning to rehabilitate your injury. Your symptoms may resolve with or without further involvement from your physician, physical therapist or athletic trainer. While completing these exercises, remember:  Restoring tissue flexibility helps normal motion to return to the joints. This allows healthier, less painful movement and activity. An effective stretch should be held for at least 30 seconds. A stretch should never be painful. You should  only feel a gentle lengthening or release in the stretched tissue. FLEXION RANGE OF MOTION AND STRETCHING EXERCISES:  STRETCH - Flexion, Single Knee to Chest  Lie on a firm bed or floor with both legs extended in front of you. Keeping one leg in contact with the floor, bring your opposite knee to your chest. Hold your leg in place by either grabbing behind your thigh or at your knee. Pull until you feel a gentle stretch in your low back. Hold 30 seconds. Slowly release your grasp and repeat the exercise with the opposite side. Repeat 2 times. Complete this exercise 3 times per week.   STRETCH - Flexion, Double Knee to Chest Lie on a firm bed or floor with both legs extended in front of you. Keeping one leg in contact with the floor, bring your opposite knee to your chest. Tense your stomach muscles to support your back and then lift your other knee to your chest. Hold your legs in place by either grabbing behind your thighs or at your knees. Pull both knees toward your chest until you feel a gentle stretch in your low back. Hold 30 seconds. Tense your stomach muscles and slowly return one leg at a time to the floor. Repeat 2 times. Complete this exercise 3 times per week.   STRETCH - Low Trunk Rotation Lie on a firm bed or floor. Keeping your legs in front of you, bend your knees so they are both pointed toward the ceiling and your feet are  flat on the floor. Extend your arms out to the side. This will stabilize your upper body by keeping your shoulders in contact with the floor. Gently and slowly drop both knees together to one side until you feel a gentle stretch in your low back. Hold for 30 seconds. Tense your stomach muscles to support your lower back as you bring your knees back to the starting position. Repeat the exercise to the other side. Repeat 2 times. Complete this exercise at least 3 times per week.   EXTENSION RANGE OF MOTION AND FLEXIBILITY EXERCISES:  STRETCH - Extension,  Prone on Elbows  Lie on your stomach on the floor, a bed will be too soft. Place your palms about shoulder width apart and at the height of your head. Place your elbows under your shoulders. If this is too painful, stack pillows under your chest. Allow your body to relax so that your hips drop lower and make contact more completely with the floor. Hold this position for 30 seconds. Slowly return to lying flat on the floor. Repeat 2 times. Complete this exercise 3 times per week.   RANGE OF MOTION - Extension, Prone Press Ups Lie on your stomach on the floor, a bed will be too soft. Place your palms about shoulder width apart and at the height of your head. Keeping your back as relaxed as possible, slowly straighten your elbows while keeping your hips on the floor. You may adjust the placement of your hands to maximize your comfort. As you gain motion, your hands will come more underneath your shoulders. Hold this position 30 seconds. Slowly return to lying flat on the floor. Repeat 2 times. Complete this exercise 3 times per week.   RANGE OF MOTION- Quadruped, Neutral Spine  Assume a hands and knees position on a firm surface. Keep your hands under your shoulders and your knees under your hips. You may place padding under your knees for comfort. Drop your head and point your tailbone toward the ground below you. This will round out your lower back like an angry cat. Hold this position for 30 seconds. Slowly lift your head and release your tail bone so that your back sags into a large arch, like an old horse. Hold this position for 30 seconds. Repeat this until you feel limber in your low back. Now, find your "sweet spot." This will be the most comfortable position somewhere between the two previous positions. This is your neutral spine. Once you have found this position, tense your stomach muscles to support your low back. Hold this position for 30 seconds. Repeat 2 times. Complete this  exercise 3 times per week.   STRENGTHENING EXERCISES - Low Back Sprain These exercises may help you when beginning to rehabilitate your injury. These exercises should be done near your "sweet spot." This is the neutral, low-back arch, somewhere between fully rounded and fully arched, that is your least painful position. When performed in this safe range of motion, these exercises can be used for people who have either a flexion or extension based injury. These exercises may resolve your symptoms with or without further involvement from your physician, physical therapist or athletic trainer. While completing these exercises, remember:  Muscles can gain both the endurance and the strength needed for everyday activities through controlled exercises. Complete these exercises as instructed by your physician, physical therapist or athletic trainer. Increase the resistance and repetitions only as guided. You may experience muscle soreness or fatigue, but the pain or  discomfort you are trying to eliminate should never worsen during these exercises. If this pain does worsen, stop and make certain you are following the directions exactly. If the pain is still present after adjustments, discontinue the exercise until you can discuss the trouble with your caregiver.  STRENGTHENING - Deep Abdominals, Pelvic Tilt  Lie on a firm bed or floor. Keeping your legs in front of you, bend your knees so they are both pointed toward the ceiling and your feet are flat on the floor. Tense your lower abdominal muscles to press your low back into the floor. This motion will rotate your pelvis so that your tail bone is scooping upwards rather than pointing at your feet or into the floor. With a gentle tension and even breathing, hold this position for 3 seconds. Repeat 2 times. Complete this exercise 3 times per week.   STRENGTHENING - Abdominals, Crunches  Lie on a firm bed or floor. Keeping your legs in front of you, bend your  knees so they are both pointed toward the ceiling and your feet are flat on the floor. Cross your arms over your chest. Slightly tip your chin down without bending your neck. Tense your abdominals and slowly lift your trunk high enough to just clear your shoulder blades. Lifting higher can put excessive stress on the lower back and does not further strengthen your abdominal muscles. Control your return to the starting position. Repeat 2 times. Complete this exercise 3 times per week.   STRENGTHENING - Quadruped, Opposite UE/LE Lift  Assume a hands and knees position on a firm surface. Keep your hands under your shoulders and your knees under your hips. You may place padding under your knees for comfort. Find your neutral spine and gently tense your abdominal muscles so that you can maintain this position. Your shoulders and hips should form a rectangle that is parallel with the floor and is not twisted. Keeping your trunk steady, lift your right hand no higher than your shoulder and then your left leg no higher than your hip. Make sure you are not holding your breath. Hold this position for 30 seconds. Continuing to keep your abdominal muscles tense and your back steady, slowly return to your starting position. Repeat with the opposite arm and leg. Repeat 2 times. Complete this exercise 3 times per week.   STRENGTHENING - Abdominals and Quadriceps, Straight Leg Raise  Lie on a firm bed or floor with both legs extended in front of you. Keeping one leg in contact with the floor, bend the other knee so that your foot can rest flat on the floor. Find your neutral spine, and tense your abdominal muscles to maintain your spinal position throughout the exercise. Slowly lift your straight leg off the floor about 6 inches for a count of 3, making sure to not hold your breath. Still keeping your neutral spine, slowly lower your leg all the way to the floor. Repeat this exercise with each leg 2 times.  Complete this exercise 3 times per week.  POSTURE AND BODY MECHANICS CONSIDERATIONS - Low Back Sprain Keeping correct posture when sitting, standing or completing your activities will reduce the stress put on different body tissues, allowing injured tissues a chance to heal and limiting painful experiences. The following are general guidelines for improved posture.  While reading these guidelines, remember: The exercises prescribed by your provider will help you have the flexibility and strength to maintain correct postures. The correct posture provides the best environment for  your joints to work. All of your joints have less wear and tear when properly supported by a spine with good posture. This means you will experience a healthier, less painful body. Correct posture must be practiced with all of your activities, especially prolonged sitting and standing. Correct posture is as important when doing repetitive low-stress activities (typing) as it is when doing a single heavy-load activity (lifting).  RESTING POSITIONS Consider which positions are most painful for you when choosing a resting position. If you have pain with flexion-based activities (sitting, bending, stooping, squatting), choose a position that allows you to rest in a less flexed posture. You would want to avoid curling into a fetal position on your side. If your pain worsens with extension-based activities (prolonged standing, working overhead), avoid resting in an extended position such as sleeping on your stomach. Most people will find more comfort when they rest with their spine in a more neutral position, neither too rounded nor too arched. Lying on a non-sagging bed on your side with a pillow between your knees, or on your back with a pillow under your knees will often provide some relief. Keep in mind, being in any one position for a prolonged period of time, no matter how correct your posture, can still lead to stiffness.  PROPER  SITTING POSTURE In order to minimize stress and discomfort on your spine, you must sit with correct posture. Sitting with good posture should be effortless for a healthy body. Returning to good posture is a gradual process. Many people can work toward this most comfortably by using various supports until they have the flexibility and strength to maintain this posture on their own. When sitting with proper posture, your ears will fall over your shoulders and your shoulders will fall over your hips. You should use the back of the chair to support your upper back. Your lower back will be in a neutral position, just slightly arched. You may place a small pillow or folded towel at the base of your lower back for  support.  When working at a desk, create an environment that supports good, upright posture. Without extra support, muscles tire, which leads to excessive strain on joints and other tissues. Keep these recommendations in mind:  CHAIR: A chair should be able to slide under your desk when your back makes contact with the back of the chair. This allows you to work closely. The chair's height should allow your eyes to be level with the upper part of your monitor and your hands to be slightly lower than your elbows.  BODY POSITION Your feet should make contact with the floor. If this is not possible, use a foot rest. Keep your ears over your shoulders. This will reduce stress on your neck and low back.  INCORRECT SITTING POSTURES  If you are feeling tired and unable to assume a healthy sitting posture, do not slouch or slump. This puts excessive strain on your back tissues, causing more damage and pain. Healthier options include: Using more support, like a lumbar pillow. Switching tasks to something that requires you to be upright or walking. Talking a brief walk. Lying down to rest in a neutral-spine position.  PROLONGED STANDING WHILE SLIGHTLY LEANING FORWARD  When completing a task that  requires you to lean forward while standing in one place for a long time, place either foot up on a stationary 2-4 inch high object to help maintain the best posture. When both feet are on the ground, the  lower back tends to lose its slight inward curve. If this curve flattens (or becomes too large), then the back and your other joints will experience too much stress, tire more quickly, and can cause pain.  CORRECT STANDING POSTURES Proper standing posture should be assumed with all daily activities, even if they only take a few moments, like when brushing your teeth. As in sitting, your ears should fall over your shoulders and your shoulders should fall over your hips. You should keep a slight tension in your abdominal muscles to brace your spine. Your tailbone should point down to the ground, not behind your body, resulting in an over-extended swayback posture.   INCORRECT STANDING POSTURES  Common incorrect standing postures include a forward head, locked knees and/or an excessive swayback. WALKING Walk with an upright posture. Your ears, shoulders and hips should all line-up.  PROLONGED ACTIVITY IN A FLEXED POSITION When completing a task that requires you to bend forward at your waist or lean over a low surface, try to find a way to stabilize 3 out of 4 of your limbs. You can place a hand or elbow on your thigh or rest a knee on the surface you are reaching across. This will provide you more stability, so that your muscles do not tire as quickly. By keeping your knees relaxed, or slightly bent, you will also reduce stress across your lower back. CORRECT LIFTING TECHNIQUES  DO : Assume a wide stance. This will provide you more stability and the opportunity to get as close as possible to the object which you are lifting. Tense your abdominals to brace your spine. Bend at the knees and hips. Keeping your back locked in a neutral-spine position, lift using your leg muscles. Lift with your legs,  keeping your back straight. Test the weight of unknown objects before attempting to lift them. Try to keep your elbows locked down at your sides in order get the best strength from your shoulders when carrying an object.   Always ask for help when lifting heavy or awkward objects. INCORRECT LIFTING TECHNIQUES DO NOT:  Lock your knees when lifting, even if it is a small object. Bend and twist. Pivot at your feet or move your feet when needing to change directions. Assume that you can safely pick up even a paperclip without proper posture.

## 2022-08-10 NOTE — Progress Notes (Signed)
Chief Complaint  Patient presents with   Follow-up    6 month Groin pain after sitting Recheck PSA    Subjective Cameron Woods is a 72 y.o. male who presents for hypertension follow up. He does monitor home blood pressures. Blood pressures ranging from 110's/60-70's on average. He is compliant with medications- olmesartan 20 mg/d, Norvasc 10 mg/d. Patient has these side effects of medication: none He is adhering to a healthy diet overall. Current exercise: active in yard No CP or SOB.   Hyperlipidemia Patient presents for dyslipidemia follow up. Currently being treated with Crestor 20 mg/d and compliance with treatment thus far has been good. He denies myalgias. Diet/exercise as above.  The patient is known to have coexisting coronary artery disease.  LBP On R side radiating into his R groin. 1-1.5 mo. No inj/change in activity. Happens when he sits for periods of time. No bruising, redness, swelling, loss of bowel/bladder. Walking/working helps.    Past Medical History:  Diagnosis Date   Aortic atherosclerosis (HCC)    GERD (gastroesophageal reflux disease)    Hyperlipidemia    Hypertension    NAFLD (nonalcoholic fatty liver disease)    Sleep apnea     Exam BP 120/70 (BP Location: Left Arm, Patient Position: Sitting, Cuff Size: Normal)   Pulse (!) 50   Temp 98.6 F (37 C) (Oral)   Ht 5\' 5"  (1.651 m)   Wt 192 lb 4 oz (87.2 kg)   SpO2 96%   BMI 31.99 kg/m  General:  well developed, well nourished, in no apparent distress Heart: RRR, no bruits, no LE edema Lungs: clear to auscultation, no accessory muscle use MSK: Tight hamstrings b/l, no ttp, neg straight leg, [place], FABER, FADIR, logroll Neuro: DTR's equal and symmetric b/l, no clonus, gait nml, 5/5 strength throughout LE's Psych: well oriented with normal range of affect and appropriate judgment/insight  Essential hypertension  Hyperlipidemia, unspecified hyperlipidemia type - Plan: Comprehensive  metabolic panel, Lipid panel  Screening for prostate cancer - Plan: PSA  Acute right-sided low back pain without sciatica  Chronic, stable. Cont Norvasc 10 mg/d, Benicar 20 mg/d. Monitor BP at home, if SBP<110, will start decreasing former med.  Counseled on diet and exercise. Chronic, stable. Cont Crestor 20 mg/d.  Est w new urology. Will ck PSA to have for him to go to that appt.  Acute low back pain: heat, ice, Tylenol, stretches/exercise. PT if no better.  F/u in 6 mo. The patient voiced understanding and agreement to the plan.  Jilda Roche [place], DO 08/10/22  1:06 PM

## 2022-08-19 DIAGNOSIS — G4733 Obstructive sleep apnea (adult) (pediatric): Secondary | ICD-10-CM | POA: Diagnosis not present

## 2022-08-19 DIAGNOSIS — E669 Obesity, unspecified: Secondary | ICD-10-CM | POA: Diagnosis not present

## 2022-08-19 DIAGNOSIS — G4761 Periodic limb movement disorder: Secondary | ICD-10-CM | POA: Diagnosis not present

## 2022-08-24 DIAGNOSIS — G4733 Obstructive sleep apnea (adult) (pediatric): Secondary | ICD-10-CM | POA: Diagnosis not present

## 2022-09-01 ENCOUNTER — Other Ambulatory Visit: Payer: Self-pay | Admitting: Family Medicine

## 2022-09-23 DIAGNOSIS — G4733 Obstructive sleep apnea (adult) (pediatric): Secondary | ICD-10-CM | POA: Diagnosis not present

## 2022-09-24 DIAGNOSIS — G4733 Obstructive sleep apnea (adult) (pediatric): Secondary | ICD-10-CM | POA: Diagnosis not present

## 2022-09-29 DIAGNOSIS — Z7689 Persons encountering health services in other specified circumstances: Secondary | ICD-10-CM | POA: Diagnosis not present

## 2022-09-29 DIAGNOSIS — Z1330 Encounter for screening examination for mental health and behavioral disorders, unspecified: Secondary | ICD-10-CM | POA: Diagnosis not present

## 2022-09-29 DIAGNOSIS — Z8042 Family history of malignant neoplasm of prostate: Secondary | ICD-10-CM | POA: Diagnosis not present

## 2022-10-22 DIAGNOSIS — G4761 Periodic limb movement disorder: Secondary | ICD-10-CM | POA: Diagnosis not present

## 2022-10-22 DIAGNOSIS — G4733 Obstructive sleep apnea (adult) (pediatric): Secondary | ICD-10-CM | POA: Diagnosis not present

## 2022-10-25 DIAGNOSIS — G4733 Obstructive sleep apnea (adult) (pediatric): Secondary | ICD-10-CM | POA: Diagnosis not present

## 2022-11-02 ENCOUNTER — Other Ambulatory Visit: Payer: Self-pay | Admitting: Family Medicine

## 2022-11-24 DIAGNOSIS — G4733 Obstructive sleep apnea (adult) (pediatric): Secondary | ICD-10-CM | POA: Diagnosis not present

## 2022-12-01 ENCOUNTER — Other Ambulatory Visit: Payer: Self-pay | Admitting: Family Medicine

## 2022-12-01 DIAGNOSIS — I10 Essential (primary) hypertension: Secondary | ICD-10-CM

## 2022-12-25 DIAGNOSIS — G4733 Obstructive sleep apnea (adult) (pediatric): Secondary | ICD-10-CM | POA: Diagnosis not present

## 2023-01-18 DIAGNOSIS — G4733 Obstructive sleep apnea (adult) (pediatric): Secondary | ICD-10-CM | POA: Diagnosis not present

## 2023-01-24 DIAGNOSIS — G4733 Obstructive sleep apnea (adult) (pediatric): Secondary | ICD-10-CM | POA: Diagnosis not present

## 2023-02-12 ENCOUNTER — Encounter: Payer: Medicare PPO | Admitting: Family Medicine

## 2023-02-23 ENCOUNTER — Encounter: Payer: Self-pay | Admitting: Family Medicine

## 2023-02-23 ENCOUNTER — Ambulatory Visit (INDEPENDENT_AMBULATORY_CARE_PROVIDER_SITE_OTHER): Payer: Medicare HMO | Admitting: Family Medicine

## 2023-02-23 VITALS — BP 126/68 | HR 92 | Temp 98.0°F | Resp 16 | Ht 65.0 in | Wt 207.4 lb

## 2023-02-23 DIAGNOSIS — I10 Essential (primary) hypertension: Secondary | ICD-10-CM | POA: Diagnosis not present

## 2023-02-23 DIAGNOSIS — R7989 Other specified abnormal findings of blood chemistry: Secondary | ICD-10-CM | POA: Diagnosis not present

## 2023-02-23 DIAGNOSIS — Z0000 Encounter for general adult medical examination without abnormal findings: Secondary | ICD-10-CM

## 2023-02-23 MED ORDER — OLMESARTAN MEDOXOMIL 20 MG PO TABS: 20.0000 mg | ORAL_TABLET | Freq: Every day | ORAL | 3 refills | Status: DC

## 2023-02-23 NOTE — Addendum Note (Signed)
Addended by: Thelma Barge D on: 02/23/2023 05:18 PM   Modules accepted: Orders

## 2023-02-23 NOTE — Progress Notes (Signed)
Chief Complaint  Patient presents with   Annual Exam    Annual Exam    Well Male Cameron Woods is here for a complete physical.   His last physical was >1 year ago.  Current diet: in general, diet could be better   Current exercise: limited Weight trend: increasing Fatigue out of ordinary? No. Seat belt? Yes.   Advanced directive? No  Health maintenance Shingrix- No Colonoscopy- Yes Tetanus- Yes Hep C- Yes Pneumonia vaccine- Yes  Past Medical History:  Diagnosis Date   Aortic atherosclerosis (HCC)    GERD (gastroesophageal reflux disease)    Hyperlipidemia    Hypertension    NAFLD (nonalcoholic fatty liver disease)    Sleep apnea      Past Surgical History:  Procedure Laterality Date   COLONOSCOPY     TONSILLECTOMY     UPPER GASTROINTESTINAL ENDOSCOPY      Medications  Current Outpatient Medications on File Prior to Visit  Medication Sig Dispense Refill   aspirin 81 MG EC tablet Take 1 tablet (81 mg total) by mouth daily. Swallow whole. 30 tablet 12   clonazePAM (KLONOPIN) 0.5 MG tablet Take 0.5 mg by mouth.     famotidine (PEPCID) 20 MG tablet Take 1 tablet (20 mg total) by mouth at bedtime. 30 tablet 0   ferrous sulfate 325 (65 FE) MG EC tablet Take 325 mg by mouth 3 (three) times daily with meals.     nadolol (CORGARD) 20 MG tablet Take 1 tablet (20 mg total) by mouth daily. 90 tablet 3   pantoprazole (PROTONIX) 40 MG tablet Take 1 tablet (40 mg total) by mouth 2 (two) times daily before a meal. 180 tablet 1   rosuvastatin (CRESTOR) 20 MG tablet Take 1 tablet (20 mg total) by mouth daily. 90 tablet 1   Allergies No Known Allergies  Family History Family History  Problem Relation Age of Onset   Stroke Mother    Cancer Father        prostate cancer   Hypertension Sister    Cancer Brother        prostate cancer   Colon cancer Neg Hx    Esophageal cancer Neg Hx    Rectal cancer Neg Hx    Stomach cancer Neg Hx     Review of Systems: Constitutional:   no fevers Eye:  no recent significant change in vision Ears:  No changes in hearing Nose/Mouth/Throat:  no complaints of nasal congestion, no sore throat Cardiovascular: no chest pain Respiratory:  No shortness of breath Gastrointestinal:  No change in bowel habits GU:  No frequency Integumentary:  no abnormal skin lesions reported Neurologic:  no headaches Endocrine:  denies unexplained weight changes  Exam BP 126/68   Pulse 92   Temp 98 F (36.7 C) (Oral)   Resp 16   Ht 5\' 5"  (1.651 m)   Wt 207 lb 6.4 oz (94.1 kg)   SpO2 98%   BMI 34.51 kg/m  General:  well developed, well nourished, in no apparent distress Skin:  no significant moles, warts, or growths Head:  no masses, lesions, or tenderness Eyes:  pupils equal and round, sclera anicteric without injection Ears:  canals without lesions, TMs shiny without retraction, no obvious effusion, no erythema Nose:  nares patent, mucosa normal Throat/Pharynx:  lips and gingiva without lesion; tongue and uvula midline; non-inflamed pharynx; no exudates or postnasal drainage Lungs:  clear to auscultation, breath sounds equal bilaterally, no respiratory distress Cardio:  regular rate  and rhythm, no LE edema or bruits Rectal: Deferred GI: BS+, S, NT, ND, no masses or organomegaly Musculoskeletal:  symmetrical muscle groups noted without atrophy or deformity Neuro:  gait normal; deep tendon reflexes normal and symmetric Psych: well oriented with normal range of affect and appropriate judgment/insight  Assessment and Plan  Well adult exam  Essential hypertension - Plan: CBC, Comprehensive metabolic panel, Lipid panel, olmesartan (BENICAR) 20 MG tablet   Well 73 y.o. male. Counseled on diet and exercise. Advanced directive form provided today.  Other orders as above. He mentions some hip pain.  Good strength.  Does not wish to pursue anything further.  Some stretches were provided in this paperwork today. Shingrix recommended to  get at the pharmacy. Follow up in 6 mo.  The patient voiced understanding and agreement to the plan.  Jilda Roche [place], DO 02/23/23 1:49 PM

## 2023-02-23 NOTE — Patient Instructions (Addendum)
Give [place] 2-3 business days to get the results of your labs back.   Keep the diet clean and stay active.  Please get me a copy of your advanced directive form at your convenience.   The Shingrix vaccine (for shingles) is a 2 shot series spaced 2-6 months apart. It can make people feel low energy, achy and almost like they have the flu for 48 hours after injection. 1/5 people can have nausea and/or vomiting. Please plan accordingly when deciding on when to get this shot. Call your pharmacy to get this. The second shot of the series is less severe regarding the side effects, but it still lasts 48 hours.   Let us know if you need anything.  Hip Exercises It is normal to feel mild stretching, pulling, tightness, or discomfort as you do these exercises, but you should stop right away if you feel sudden pain or your pain gets worse.   STRETCHING AND RANGE OF MOTION EXERCISES These exercises warm up your muscles and joints and improve the movement and flexibility of your hip. These exercises also help to relieve pain, numbness, and tingling. Exercise A: Hamstrings, Supine  Lie on your back. Loop a belt or towel over the ball of your left / right foot. The ball of your foot is on the walking surface, right under your toes. Straighten your left / right knee and slowly pull on the belt to raise your leg. Do not let your left / right knee bend while you do this. Keep your other leg flat on the floor. Raise the left / right leg until you feel a gentle stretch behind your left / right knee or thigh. Hold this position for 30 seconds. Slowly return your leg to the starting position. Repeat2 times. Complete this stretch 3 times per week. Exercise B: Hip Rotators  Lie on your back on a firm surface. Hold your left / right knee with your left / right hand. Hold your ankle with your other hand. Gently pull your left / right knee and rotate your lower leg toward your other shoulder. Pull until you feel a  stretch in your buttocks. Keep your hips and shoulders firmly planted while you do this stretch. Hold this position for 30 seconds. Repeat 2 times. Complete this stretch 3 times per week. Exercise C: V-Sit (Hamstrings and Adductors)  Sit on the floor with your legs extended in a large "V" shape. Keep your knees straight during this exercise. Start with your head and chest upright, then bend at your waist to reach for your left foot (position A). You should feel a stretch in your right inner thigh. Hold this position for 30 seconds. Then slowly return to the upright position. Bend at your waist to reach forward (position B). You should feel a stretch behind both of your thighs and knees. Hold this position for 30 seconds. Then slowly return to the upright position. Bend at your waist to reach for your right foot (position C). You should feel a stretch in your left inner thigh. Hold this position for 30 seconds. Then slowly return to the upright position. Repeat A, B, and C 2 times each. Complete this stretch 3 times per week. Exercise D: Lunge (Hip Flexors)  Place your left / right knee on the floor and bend your other knee so that is directly over your ankle. You should be half-kneeling. Keep good posture with your head over your shoulders. Tighten your buttocks to point your tailbone downward. This  helps your back to keep from arching too much. You should feel a gentle stretch in the front of your left / right thigh and hip. If you do not feel any resistance, slightly slide your other foot forward and then slowly lunge forward so your knee once again lines up over your ankle. Make sure your tailbone continues to point downward. Hold this position for 30 seconds. Repeat 2 times. Complete this stretch 3 times per week.  STRENGTHENING EXERCISES These exercises build strength and endurance in your hip. Endurance is the ability to use your muscles for a long time, even after they get  tired. Exercise E: Bridge (Hip Extensors)  Lie on your back on a firm surface with your knees bent and your feet flat on the floor. Tighten your buttocks muscles and lift your bottom off the floor until the trunk of your body is level with your thighs. Do not arch your back. You should feel the muscles working in your buttocks and the back of your thighs. If you do not feel these muscles, slide your feet 1-2 inches (2.5-5 cm) farther away from your buttocks. Hold this position for 3 seconds. Slowly lower your hips to the starting position. Repeat for a total of 10 repetitions. Let your muscles relax completely between repetitions. If this exercise is too easy, try doing it with your arms crossed over your chest. Repeat 2 times. Complete this exercise 3 times per week. Exercise F: Straight Leg Raises - Hip Abductors  Lie on your side with your left / right leg in the top position. Lie so your head, shoulder, knee, and hip line up with each other. You may bend your bottom knee to help you balance. Roll your hips slightly forward, so your hips are stacked directly over each other and your left / right knee is facing forward. Leading with your heel, lift your top leg 4-6 inches (10-15 cm). You should feel the muscles in your outer hip lifting. Do not let your foot drift forward. Do not let your knee roll toward the ceiling. Hold this position for 1 second. Slowly return to the starting position. Let your muscles relax completely between repetitions. Repeat for a total of 10 repetitions.  Repeat 2 times. Complete this exercise 3 times per week. Exercise G: Straight Leg Raises - Hip Adductors  Lie on your side with your left / right leg in the bottom position. Lie so your head, shoulder, knee, and hip line up. You may place your upper foot in front to help you balance. Roll your hips slightly forward, so your hips are stacked directly over each other and your left / right knee is facing  forward. Tense the muscles in your inner thigh and lift your bottom leg 4-6 inches (10-15 cm). Hold this position for 1 second. Slowly return to the starting position. Let your muscles relax completely between repetitions. Repeat for a total of 10 repetitions. Repeat 2 times. Complete this exercise 3 times per week. Exercise H: Straight Leg Raises - Quadriceps  Lie on your back with your left / right leg extended and your other knee bent. Tense the muscles in the front of your left / right thigh. When you do this, you should see your kneecap slide up or see increased dimpling just above your knee. Tighten these muscles even more and raise your leg 4-6 inches (10-15 cm) off the floor. Hold this position for 3 seconds. Keep these muscles tense as you lower your leg. Relax  the muscles slowly and completely between repetitions. Repeat for a total of 10 repetitions. Repeat 2 times. Complete this exercise 3 times per week. Exercise I: Hip Abductors, Standing Tie one end of a rubber exercise band or tubing to a secure surface, such as a table or pole. Loop the other end of the band or tubing around your left / right ankle. Keeping your ankle with the band or tubing directly opposite of the secured end, step away until there is tension in the tubing or band. Hold onto a chair as needed for balance. Lift your left / right leg out to your side. While you do this: Keep your back upright. Keep your shoulders over your hips. Keep your toes pointing forward. Make sure to use your hip muscles to lift your leg. Do not "throw" your leg or tip your body to lift your leg. Hold this position for 1 second. Slowly return to the starting position. Repeat for a total of 10 repetitions. Repeat 2 times. Complete this exercise 3 times per week. Exercise J: Squats (Quadriceps) Stand in a door frame so your feet and knees are in line with the frame. You may place your hands on the frame for balance. Slowly bend your  knees and lower your hips like you are going to sit in a chair. Keep your lower legs in a straight-up-and-down position. Do not let your hips go lower than your knees. Do not bend your knees lower than told by your health care provider. If your hip pain increases, do not bend as low. Hold this position for 1 second. Slowly push with your legs to return to standing. Do not use your hands to pull yourself to standing. Repeat for a total of 10 repetitions. Repeat 2 times. Complete this exercise 3 times per week. Make sure you discuss any questions you have with your health care provider. Document Released: 02/06/2005 Document Revised: 10/14/2015 Document Reviewed: 01/14/2015 Elsevier Interactive Patient Education  [employer].

## 2023-02-24 ENCOUNTER — Other Ambulatory Visit: Payer: Self-pay

## 2023-02-24 ENCOUNTER — Encounter: Payer: Self-pay | Admitting: Family Medicine

## 2023-02-25 LAB — LIPID PANEL
Cholesterol: 88 mg/dL (ref <200)
HDL: 35 mg/dL — ABNORMAL LOW (ref >=40)
LDL Cholesterol (Calc): 36 mg/dL
Non-HDL Cholesterol (Calc): 53 mg/dL (ref <130)
Total CHOL/HDL Ratio: 2.5 (calc) (ref <5.0)
Triglycerides: 89 mg/dL (ref <150)

## 2023-02-25 LAB — CBC
HCT: 33.2 % — ABNORMAL LOW (ref 38.5–50.0)
Hemoglobin: 10.8 g/dL — ABNORMAL LOW (ref 13.2–17.1)
MCH: 26.9 pg — ABNORMAL LOW (ref 27.0–33.0)
MCHC: 32.5 g/dL (ref 32.0–36.0)
MCV: 82.6 fL (ref 80.0–100.0)
Platelets: 86 10*3/uL — ABNORMAL LOW (ref 140–400)
RBC: 4.02 10*6/uL — ABNORMAL LOW (ref 4.20–5.80)
RDW: 14.5 % (ref 11.0–15.0)
WBC: 3.9 10*3/uL (ref 3.8–10.8)

## 2023-02-25 LAB — COMPREHENSIVE METABOLIC PANEL
AG Ratio: 1.9 (calc) (ref 1.0–2.5)
ALT: 25 U/L (ref 9–46)
AST: 29 U/L (ref 10–35)
Albumin: 4.2 g/dL (ref 3.6–5.1)
Alkaline phosphatase (APISO): 123 U/L (ref 35–144)
BUN: 12 mg/dL (ref 7–25)
CO2: 27 mmol/L (ref 20–32)
Calcium: 9.3 mg/dL (ref 8.6–10.3)
Chloride: 106 mmol/L (ref 98–110)
Creat: 0.93 mg/dL (ref 0.70–1.28)
Globulin: 2.2 g/dL (ref 1.9–3.7)
Glucose, Bld: 92 mg/dL (ref 65–99)
Potassium: 4.5 mmol/L (ref 3.5–5.3)
Sodium: 140 mmol/L (ref 135–146)
Total Bilirubin: 0.6 mg/dL (ref 0.2–1.2)
Total Protein: 6.4 g/dL (ref 6.1–8.1)

## 2023-02-25 LAB — PERIPHERAL BLOOD SMEAR REVIEW

## 2023-03-09 ENCOUNTER — Telehealth: Payer: Self-pay

## 2023-03-09 NOTE — Telephone Encounter (Signed)
 Copied from CRM [id number]. Topic: Clinical - Lab/Test Results >> Mar 09, 2023 10:35 AM Mercedes MATSU wrote: Reason for CRM: [facility] Rep Arthurine Schmitz called to speak to Sueanne Server, who had reached out to [facility] for clarification because she was unclear of results she received on a patient.  Ms. Schmitz can be reached at [phone], or on her cell [phone].

## 2023-03-11 ENCOUNTER — Telehealth: Payer: Self-pay

## 2023-03-11 NOTE — Telephone Encounter (Signed)
 Copied from CRM [id number]. Topic: Clinical - Request for Lab/Test Order >> Mar 11, 2023 12:40 PM Juleen Oakland F wrote: Reason for CRM: Patient calling to follow up on extra testing that needs to be done due to labs results

## 2023-03-11 NOTE — Telephone Encounter (Signed)
 Copied from CRM [id number]. Topic: Clinical - Lab/Test Results >> Mar 10, 2023  4:58 PM Taleah C wrote: Reason for CRM: pt called and stated that he has not been able to access his MyChart to read his lab results. I passed along Dr. Gena message on his labs from 1/21. The patient is asking for a nurse to give him a callback to follow-up on his next steps. Please call and advise.

## 2023-03-12 ENCOUNTER — Other Ambulatory Visit: Payer: Self-pay

## 2023-03-12 ENCOUNTER — Other Ambulatory Visit: Payer: Medicare HMO

## 2023-03-12 DIAGNOSIS — I10 Essential (primary) hypertension: Secondary | ICD-10-CM

## 2023-03-12 NOTE — Addendum Note (Signed)
 Addended by: Marylou Sobers D on: 03/12/2023 02:22 PM   Modules accepted: Orders

## 2023-03-12 NOTE — Telephone Encounter (Signed)
 Called spoke with pt labs ordered and he's coming in today for blood.

## 2023-03-12 NOTE — Telephone Encounter (Signed)
 Spoke with Zebedee at [address]. She states smear review was actually done on this patient and noted under the CBC platelet area with comment 'no clumps seen'.  She is also forwarding me an email with information for our Providers regarding the peripheral blood smear review to help understand when this will be performed.

## 2023-03-13 NOTE — Telephone Encounter (Signed)
 Unhelpful in this situation. He just needs to comes in for the repeat.

## 2023-03-15 ENCOUNTER — Telehealth: Payer: Self-pay

## 2023-03-15 DIAGNOSIS — I10 Essential (primary) hypertension: Secondary | ICD-10-CM

## 2023-03-15 NOTE — Telephone Encounter (Signed)
Called pt - lab appt scheduled.

## 2023-03-15 NOTE — Telephone Encounter (Signed)
Spoke with PCP regarding info received from Lab. Will repeat CBCw/diff.

## 2023-03-15 NOTE — Telephone Encounter (Signed)
 Called pt was advised Dr.Wendling wanting to schedule lab appt for Cbc,appt scheduled Wednesday.

## 2023-03-17 ENCOUNTER — Other Ambulatory Visit (INDEPENDENT_AMBULATORY_CARE_PROVIDER_SITE_OTHER): Payer: Medicare HMO

## 2023-03-17 DIAGNOSIS — I10 Essential (primary) hypertension: Secondary | ICD-10-CM

## 2023-03-17 LAB — CBC WITH DIFFERENTIAL/PLATELET
Absolute Lymphocytes: 1435 {cells}/uL (ref 850–3900)
Absolute Monocytes: 308 {cells}/uL (ref 200–950)
Basophils Absolute: 29 {cells}/uL (ref 0–200)
Basophils Relative: 0.7 %
Eosinophils Absolute: 111 {cells}/uL (ref 15–500)
Eosinophils Relative: 2.7 %
HCT: 34 % — ABNORMAL LOW (ref 38.5–50.0)
Hemoglobin: 10.9 g/dL — ABNORMAL LOW (ref 13.2–17.1)
MCH: 26.1 pg — ABNORMAL LOW (ref 27.0–33.0)
MCHC: 32.1 g/dL (ref 32.0–36.0)
MCV: 81.3 fL (ref 80.0–100.0)
MPV: 10.1 fL (ref 7.5–12.5)
Monocytes Relative: 7.5 %
Neutro Abs: 2218 {cells}/uL (ref 1500–7800)
Neutrophils Relative %: 54.1 %
Platelets: 92 10*3/uL — ABNORMAL LOW (ref 140–400)
RBC: 4.18 10*6/uL — ABNORMAL LOW (ref 4.20–5.80)
RDW: 14.5 % (ref 11.0–15.0)
Total Lymphocyte: 35 %
WBC: 4.1 10*3/uL (ref 3.8–10.8)

## 2023-03-18 ENCOUNTER — Encounter: Payer: Self-pay | Admitting: Family Medicine

## 2023-03-18 DIAGNOSIS — D649 Anemia, unspecified: Secondary | ICD-10-CM

## 2023-03-18 NOTE — Addendum Note (Signed)
Addended by: Maximino Sarin on: 03/18/2023 10:48 AM   Modules accepted: Orders

## 2023-03-18 NOTE — Telephone Encounter (Signed)
Pt called and lvm to return call to schedule a lab visit for iron studies

## 2023-03-18 NOTE — Addendum Note (Signed)
Addended by: Maximino Sarin on: 03/18/2023 10:49 AM   Modules accepted: Orders

## 2023-03-18 NOTE — Telephone Encounter (Signed)
Iron studies unable to be added.  Only lavender tube drawn.  Pt has to come back in.

## 2023-03-31 ENCOUNTER — Other Ambulatory Visit: Payer: Self-pay | Admitting: Family Medicine

## 2023-04-01 NOTE — Telephone Encounter (Signed)
 Reason for CRM: Patients child(Nicole) called to see if the iron contents were going to be drawn from the labs that were taken on 03/17/2023 or if the patient will need to come in for more labs to check the iron content. Please follow up with patient daughter(Nicole)#[phone]

## 2023-04-02 NOTE — Telephone Encounter (Signed)
 Patient's daughter was advised and stated that she will make patient aware so appointment could be made.

## 2023-04-06 NOTE — Addendum Note (Signed)
 Addended by: Thelma Barge D on: 04/06/2023 05:40 PM   Modules accepted: Orders

## 2023-04-07 ENCOUNTER — Other Ambulatory Visit (INDEPENDENT_AMBULATORY_CARE_PROVIDER_SITE_OTHER)

## 2023-04-07 ENCOUNTER — Encounter: Payer: Self-pay | Admitting: Family Medicine

## 2023-04-07 DIAGNOSIS — D649 Anemia, unspecified: Secondary | ICD-10-CM | POA: Diagnosis not present

## 2023-04-07 LAB — IBC + FERRITIN
Ferritin: 6.8 ng/mL — ABNORMAL LOW (ref 22.0–322.0)
Iron: 22 ug/dL — ABNORMAL LOW (ref 42–165)
Saturation Ratios: 5.7 % — ABNORMAL LOW (ref 20.0–50.0)
TIBC: 387.8 ug/dL (ref 250.0–450.0)
Transferrin: 277 mg/dL (ref 212.0–360.0)

## 2023-04-08 ENCOUNTER — Other Ambulatory Visit: Payer: Self-pay | Admitting: Family Medicine

## 2023-04-09 ENCOUNTER — Other Ambulatory Visit: Payer: Self-pay

## 2023-04-09 DIAGNOSIS — D509 Iron deficiency anemia, unspecified: Secondary | ICD-10-CM

## 2023-04-12 ENCOUNTER — Encounter: Payer: Self-pay | Admitting: Family Medicine

## 2023-04-12 ENCOUNTER — Other Ambulatory Visit (INDEPENDENT_AMBULATORY_CARE_PROVIDER_SITE_OTHER)

## 2023-04-12 DIAGNOSIS — D509 Iron deficiency anemia, unspecified: Secondary | ICD-10-CM | POA: Diagnosis not present

## 2023-04-12 LAB — URINALYSIS, MICROSCOPIC ONLY: RBC / HPF: NONE SEEN (ref 0–?)

## 2023-04-19 ENCOUNTER — Ambulatory Visit: Payer: Medicare HMO | Admitting: Internal Medicine

## 2023-04-19 ENCOUNTER — Encounter: Payer: Self-pay | Admitting: Internal Medicine

## 2023-04-19 VITALS — BP 108/54 | HR 64 | Ht 65.0 in | Wt 209.0 lb

## 2023-04-19 DIAGNOSIS — F1091 Alcohol use, unspecified, in remission: Secondary | ICD-10-CM

## 2023-04-19 DIAGNOSIS — K7030 Alcoholic cirrhosis of liver without ascites: Secondary | ICD-10-CM

## 2023-04-19 DIAGNOSIS — D509 Iron deficiency anemia, unspecified: Secondary | ICD-10-CM

## 2023-04-19 DIAGNOSIS — D696 Thrombocytopenia, unspecified: Secondary | ICD-10-CM

## 2023-04-19 DIAGNOSIS — Z8719 Personal history of other diseases of the digestive system: Secondary | ICD-10-CM | POA: Diagnosis not present

## 2023-04-19 DIAGNOSIS — I8500 Esophageal varices without bleeding: Secondary | ICD-10-CM | POA: Diagnosis not present

## 2023-04-19 MED ORDER — FERROUS SULFATE 325 (65 FE) MG PO TBEC: 325.0000 mg | DELAYED_RELEASE_TABLET | Freq: Every day | ORAL | 3 refills | Status: DC

## 2023-04-19 MED ORDER — NA SULFATE-K SULFATE-MG SULF 17.5-3.13-1.6 GM/177ML PO SOLN: ORAL | 0 refills | Status: DC

## 2023-04-19 NOTE — Progress Notes (Signed)
 Review of pertinent gastrointestinal problems: 1. Cirrhosis, likely from previous alcohol abuse. Ultrasound 05/2021: cirrhosis without mass lesions MELD-Na 7 (05/2021 labs) EGD 05/2021 small esophageal varices, mild portal gastropathy, multiple hyperplastic polyps throughout the stomach biopsy-proven. Blood work 05/2021: Immune to hepatitis A, normal TTG, ceruloplasmin normal, antimitochondrial antibody negative, alpha-1 antitrypsin level normal, ANA negative, ferritin normal, hepatitis C antibody negative, hepatitis B surface antibody nonreactive, hepatitis B surface antigen nonreactive, Alk phos chronically elevated around 1 30-200, AST intermittently elevated up to around 55. Last alcoholic beverage April 2023  2.  History of irregular Z-line, very short segment Barrett's (without dysplasia) however this was not seen on EGD 05/2021.  EGD 12/2014 Dr. Bosie Clos indication "esophageal reflux, Barrett's esophagus."  Findings 1 cm of Barrett's appearing mucosa just above the GE junction.  Gastritis.  Esophagus was biopsied and this showed "intestinal metaplasia consistent with Barrett's esophagus."  Also reflux damage.  EGD 03/2018 Dr. Bosie Clos indication "esophageal reflux, Barrett's esophagus."  Findings exactly the same as the previous findings, a very short segment Barrett's appearing mucosa at the GE junction.  Pathology "Barrett's mucosa.  Negative for dysplasia.  (1 fragment of tissue)"  3.  Colonoscopy 12/12/2014 Dr. Bosie Clos indication "personal history of colonic polyps, last colonoscopy 2007.  Hemorrhoids were noted.  The examination was otherwise normal.  He was recommended to have repeat colonoscopy at 5-year interval however I am not sure why that is the case as I do not have any previous records from [facility].  He is in for recall colonoscopy 12/2024  HPI: 73 year old male with history of alcoholic cirrhosis, GERD, and OSA presents for follow up of alcoholic cirrhosis  Interval  History: Denies hematochezia or melena. He gets out and works every day. He feels well. Denies lightheaded or shortness of breath. Denies confusion. Denies swelling in his legs or abdomen. He is not currently on an iron supplement. Denies diarrhea or constipation. Weight has been stable recently. He has not had any alcohol for the last 2 years. He has been good about taking his nadolol. Denies NSAID use.   Wt Readings from Last 3 Encounters:  04/19/23 209 lb (94.8 kg)  02/23/23 207 lb 6.4 oz (94.1 kg)  08/10/22 192 lb 4 oz (87.2 kg)   Current Outpatient Medications  Medication Instructions   aspirin EC 81 mg, Oral, Daily, Swallow whole.   clonazePAM (KLONOPIN) 0.5 mg   famotidine (PEPCID) 20 mg, Oral, Daily at bedtime   ferrous sulfate 325 mg, Oral, Daily   Na Sulfate-K Sulfate-Mg Sulfate concentrate (SUPREP) 17.5-3.13-1.6 GM/177ML SOLN Use as directed; may use generic; goodrx card if insurance will not cover generic   nadolol (CORGARD) 20 mg, Oral, Daily   olmesartan (BENICAR) 20 mg, Oral, Daily   pantoprazole (PROTONIX) 40 mg, Oral, 2 times daily before meals   rosuvastatin (CRESTOR) 20 MG tablet TAKE 1 TABLET BY MOUTH EVERY DAY FOR CHOLESTEROL   Physical Exam: BP (!) 108/54   Pulse 64   Ht 5\' 5"  (1.651 m)   Wt 209 lb (94.8 kg)   BMI 34.78 kg/m  Constitutional: generally well-appearing Psychiatric: alert and oriented Cardiovascular: regular rate Resp: no increased WOB Abdomen: soft, nontender, nondistended, no obvious ascites, no peritoneal signs, normal bowel sounds Extremities: No peripheral edema noted in lower extremities Neuro: No asterixis  Labs 02/2022: CBC with low plts of 111. CMP with mildly elevated alk phos of 120.  Labs 02/2023: CBC with low Hb of 10.8 and low plts 86. CMP nml.  Labs 04/2023: CBC with low Hb of 10.9 and low plts 92. Ferritin low at 6.8.   Ab U/S 05/08/21: IMPRESSION: Nodularity in the liver surface suggests cirrhosis.  Splenomegaly. Abdominal  sonogram is otherwise unremarkable.  EGD 05/27/21: - Two trunks of small esophageal varices without signs of recent bleeding. - Mild inflammation characterized by erythema and friability was found in the gastric antrum. Biopsies were taken with a cold forceps for histology. - Numerous (25-30) soft, fleshy, friable gastric polyps in the proximal 2/3 of the stomach. These ranged in size from 5mm to 2cm. They are likely hyperplastic polyps, I sampled several with forceps. - Changes of mild portal gastropathy throughout the proximal stomach. No gastric varices 1. Surgical [P], gastric antrum REACTIVE GASTROPATHY NEGATIVE FOR H. PYLORI, INTESTINAL METAPLASIA, DYSPLASIA AND CARCINOMA 2. Surgical [P], gastric polyps COMPATIBLE WITH ULCERATED HYPERPLASTIC POLYP REACTIVE GASTROPATHY WITH FOVEOLAR HYPERPLASIA AND EROSION/ULCER WITH ULCER SLOUGH NEGATIVE FOR H. PYLORI, INTESTINAL METAPLASIA, DYSPLASIA AND CARCINOMA  Assessment and plan: EtOH cirrhosis History of small esophageal varices Iron deficiency anemia Thrombocytopenia History of hyperplastic gastric polyp Patient presents for follow-up alcoholic cirrhosis, which appears to be well compensated at this time. He has not had any alcohol for the last 2 years.  His last set of LFTs look good.  Interestingly starting in his labs in 02/2023, he developed iron deficiency anemia.  Patient states that he has not been on any oral iron supplements.  Will go ahead and start him on some oral iron support plan for an EGD/colonoscopy for further evaluation.  Patient does have history of friable gastric polyps, which could be a source of bleeding.  However, patient's last colonoscopy was in 2016 so would be reasonable to do an EGD at the same time as his colonoscopy procedure.  Will also get him updated with his HCC screening - Cont nadolol 20 mg QD. Refill - Start ferrous sulfate 325 mg every day. Okay to use daily stool softener or laxative if constipation arises. -  Will plan for RUQ U/S for HCC screening - EGD/colonoscopy [facility] for IDA  Eulah Pont, MD [facility] 04/19/2023, 12:53 PM  Total time on date of encounter was 34 minutes (this included time spent preparing to see the patient reviewing records; obtaining and/or reviewing separately obtained history; performing a medically appropriate exam and/or evaluation; counseling and educating the patient and family if present; ordering medications, tests or procedures if applicable; and documenting clinical information in the health record).

## 2023-04-19 NOTE — Patient Instructions (Signed)
 You have been scheduled for an abdominal ultrasound at [facility] (1st floor of hospital) on 04/27/23 at 10 am. Please arrive 30 minutes prior to your appointment for registration. Make certain not to have anything to eat or drink after midnight. Should you need to reschedule your appointment, please contact radiology at [phone]. This test typically takes about 30 minutes to perform.   You have been scheduled for an endoscopy and colonoscopy. Please follow the written instructions given to you at your visit today.  If you use inhalers (even only as needed), please bring them with you on the day of your procedure.  DO NOT TAKE 7 DAYS PRIOR TO TEST- Trulicity (dulaglutide) Ozempic, Wegovy (semaglutide) Mounjaro (tirzepatide) Bydureon Bcise (exanatide extended release)  DO NOT TAKE 1 DAY PRIOR TO YOUR TEST Rybelsus (semaglutide) Adlyxin (lixisenatide) Victoza (liraglutide) Byetta (exanatide)  We have sent the following medications to your pharmacy for you to pick up at your convenience: Suprep, ferrous sulfate    Start taking ferrous sulfate 325 mg 1 time daily  Okay to use stool softener daily or as needed   _______________________________________________________  If your blood pressure at your visit was 140/90 or greater, please contact your primary care physician to follow up on this.  _______________________________________________________  If you are age 71 or older, your body mass index should be between 23-30. Your Body mass index is 34.78 kg/m. If this is out of the aforementioned range listed, please consider follow up with your Primary Care Provider.  If you are age 19 or younger, your body mass index should be between 19-25. Your Body mass index is 34.78 kg/m. If this is out of the aformentioned range listed, please consider follow up with your Primary Care Provider.   ________________________________________________________  The [facility] providers would  like to encourage you to use [facility] to communicate with providers for non-urgent requests or questions.  Due to long hold times on the telephone, sending your provider a message by [facility] may be a faster and more efficient way to get a response.  Please allow 48 business hours for a response.  Please remember that this is for non-urgent requests.  _______________________________________________________  Due to recent changes in healthcare laws, you may see the results of your imaging and laboratory studies on MyChart before your provider has had a chance to review them.  We understand that in some cases there may be results that are confusing or concerning to you. Not all laboratory results come back in the same time frame and the provider may be waiting for multiple results in order to interpret others.  Please give [place] 48 hours in order for your provider to thoroughly review all the results before contacting the office for clarification of your results.   Thank you for entrusting me with your care and for choosing [facility],  Dr. Eulah Pont

## 2023-04-22 DIAGNOSIS — G4733 Obstructive sleep apnea (adult) (pediatric): Secondary | ICD-10-CM | POA: Diagnosis not present

## 2023-04-22 DIAGNOSIS — G4700 Insomnia, unspecified: Secondary | ICD-10-CM | POA: Diagnosis not present

## 2023-04-27 ENCOUNTER — Ambulatory Visit (HOSPITAL_COMMUNITY)
Admission: RE | Admit: 2023-04-27 | Discharge: 2023-04-27 | Disposition: A | Discharge status: Home / Self Care | Source: Ambulatory Visit | Attending: Internal Medicine | Admitting: Internal Medicine

## 2023-04-27 DIAGNOSIS — K7030 Alcoholic cirrhosis of liver without ascites: Secondary | ICD-10-CM | POA: Insufficient documentation

## 2023-04-27 DIAGNOSIS — I8500 Esophageal varices without bleeding: Secondary | ICD-10-CM | POA: Diagnosis not present

## 2023-04-27 DIAGNOSIS — D509 Iron deficiency anemia, unspecified: Secondary | ICD-10-CM | POA: Diagnosis not present

## 2023-04-28 ENCOUNTER — Encounter: Payer: Self-pay | Admitting: Internal Medicine

## 2023-05-14 ENCOUNTER — Encounter: Payer: Self-pay | Admitting: Internal Medicine

## 2023-05-24 ENCOUNTER — Ambulatory Visit: Admitting: Internal Medicine

## 2023-05-24 ENCOUNTER — Encounter: Payer: Self-pay | Admitting: Internal Medicine

## 2023-05-24 VITALS — BP 131/66 | HR 68 | Temp 98.1°F | Resp 19 | Ht 65.0 in | Wt 209.0 lb

## 2023-05-24 DIAGNOSIS — K319 Disease of stomach and duodenum, unspecified: Secondary | ICD-10-CM | POA: Diagnosis not present

## 2023-05-24 DIAGNOSIS — K634 Enteroptosis: Secondary | ICD-10-CM | POA: Diagnosis not present

## 2023-05-24 DIAGNOSIS — K5140 Inflammatory polyps of colon without complications: Secondary | ICD-10-CM | POA: Diagnosis not present

## 2023-05-24 DIAGNOSIS — D509 Iron deficiency anemia, unspecified: Secondary | ICD-10-CM

## 2023-05-24 DIAGNOSIS — K648 Other hemorrhoids: Secondary | ICD-10-CM

## 2023-05-24 DIAGNOSIS — K5520 Angiodysplasia of colon without hemorrhage: Secondary | ICD-10-CM | POA: Diagnosis not present

## 2023-05-24 DIAGNOSIS — K7030 Alcoholic cirrhosis of liver without ascites: Secondary | ICD-10-CM

## 2023-05-24 DIAGNOSIS — D125 Benign neoplasm of sigmoid colon: Secondary | ICD-10-CM | POA: Diagnosis not present

## 2023-05-24 DIAGNOSIS — I8500 Esophageal varices without bleeding: Secondary | ICD-10-CM | POA: Diagnosis not present

## 2023-05-24 DIAGNOSIS — G4730 Sleep apnea, unspecified: Secondary | ICD-10-CM | POA: Diagnosis not present

## 2023-05-24 DIAGNOSIS — K635 Polyp of colon: Secondary | ICD-10-CM | POA: Diagnosis not present

## 2023-05-24 DIAGNOSIS — D122 Benign neoplasm of ascending colon: Secondary | ICD-10-CM | POA: Diagnosis not present

## 2023-05-24 DIAGNOSIS — I10 Essential (primary) hypertension: Secondary | ICD-10-CM | POA: Diagnosis not present

## 2023-05-24 DIAGNOSIS — K317 Polyp of stomach and duodenum: Secondary | ICD-10-CM

## 2023-05-24 DIAGNOSIS — D123 Benign neoplasm of transverse colon: Secondary | ICD-10-CM | POA: Diagnosis not present

## 2023-05-24 DIAGNOSIS — K3189 Other diseases of stomach and duodenum: Secondary | ICD-10-CM

## 2023-05-24 MED ORDER — SODIUM CHLORIDE 0.9 % IV SOLN: 500.0000 mL | Freq: Once | INTRAVENOUS | Status: DC

## 2023-05-24 NOTE — Op Note (Signed)
 [facility] Patient Name: Cameron Woods Procedure Date: 05/24/2023 1:49 PM MRN: [id number] Endoscopist: Pedro Bourgeois , , [id number] Age: 73 Referring MD:  Date of Birth: May 31, 1950 Gender: Male Account #: [id number] Procedure:                Colonoscopy Indications:              Iron deficiency anemia Medicines:                Monitored Anesthesia Care Procedure:                Pre-Anesthesia Assessment:                           - Prior to the procedure, a History and Physical                            was performed, and patient medications and                            allergies were reviewed. The patient's tolerance of                            previous anesthesia was also reviewed. The risks                            and benefits of the procedure and the sedation                            options and risks were discussed with the patient.                            All questions were answered, and informed consent                            was obtained. Prior Anticoagulants: The patient has                            taken no anticoagulant or antiplatelet agents. ASA                            Grade Assessment: III - A patient with severe                            systemic disease. After reviewing the risks and                            benefits, the patient was deemed in satisfactory                            condition to undergo the procedure.                           After obtaining informed consent, the colonoscope  was passed under direct vision. Throughout the                            procedure, the patient's blood pressure, pulse, and                            oxygen saturations were monitored continuously. The                            Olympus Scope SN: [id number] was introduced through                            the anus and advanced to the the terminal ileum.                            The colonoscopy was performed  without difficulty.                            The patient tolerated the procedure well. The                            quality of the bowel preparation was excellent. The                            terminal ileum, ileocecal valve, appendiceal                            orifice, and rectum were photographed. Scope In: 2:16:41 PM Scope Out: 2:35:59 PM Scope Withdrawal Time: 0 hours 12 minutes 19 seconds  Total Procedure Duration: 0 hours 19 minutes 18 seconds  Findings:                 The terminal ileum appeared normal.                           Three localized angioectasias without bleeding were                            found in the ascending colon.                           Two sessile polyps were found in the transverse                            colon and ascending colon. The polyps were 3 to 5                            mm in size. These polyps were removed with a cold                            snare. Resection and retrieval were complete.                           A 3 mm polyp was found in the  sigmoid colon. The                            polyp was sessile. The polyp was removed with a                            cold snare. Resection and retrieval were complete.                           Non-bleeding internal hemorrhoids were found during                            retroflexion. Complications:            No immediate complications. Estimated Blood Loss:     Estimated blood loss was minimal. Impression:               - The examined portion of the ileum was normal.                           - Three non-bleeding colonic angioectasias.                           - Two 3 to 5 mm polyps in the transverse colon and                            in the ascending colon, removed with a cold snare.                            Resected and retrieved.                           - One 3 mm polyp in the sigmoid colon, removed with                            a cold snare. Resected and retrieved.                            - Non-bleeding internal hemorrhoids. Recommendation:           - Discharge patient to home (with escort).                           - Await pathology results.                           - Return to GI clinic in 2-3 months for follow up                            of iron deficiency anemia.                           - The findings and recommendations were discussed                            with the patient. Dr  Pedro Bourgeois [address]" [name],  05/24/2023 2:46:36 PM

## 2023-05-24 NOTE — Progress Notes (Signed)
 Called to room to assist during endoscopic procedure.  Patient ID and intended procedure confirmed with present staff. Received instructions for my participation in the procedure from the performing physician.

## 2023-05-24 NOTE — Op Note (Signed)
 [facility] Patient Name: Cameron Woods Procedure Date: 05/24/2023 1:57 PM MRN: [id number] Endoscopist: Pedro Bourgeois , , [id number] Age: 73 Referring MD:  Date of Birth: 1950/02/14 Gender: Male Account #: [id number] Procedure:                Upper GI endoscopy Indications:              Iron deficiency anemia Medicines:                Monitored Anesthesia Care Procedure:                Pre-Anesthesia Assessment:                           - Prior to the procedure, a History and Physical                            was performed, and patient medications and                            allergies were reviewed. The patient's tolerance of                            previous anesthesia was also reviewed. The risks                            and benefits of the procedure and the sedation                            options and risks were discussed with the patient.                            All questions were answered, and informed consent                            was obtained. Prior Anticoagulants: The patient has                            taken no anticoagulant or antiplatelet agents. ASA                            Grade Assessment: III - A patient with severe                            systemic disease. After reviewing the risks and                            benefits, the patient was deemed in satisfactory                            condition to undergo the procedure.                           After obtaining informed consent, the endoscope was  passed under direct vision. Throughout the                            procedure, the patient's blood pressure, pulse, and                            oxygen saturations were monitored continuously. The                            GIF [id number] #[id number] was introduced through the                            mouth, and advanced to the second part of duodenum.                            The upper GI endoscopy was  accomplished without                            difficulty. The patient tolerated the procedure                            well. Scope In: Scope Out: Findings:                 Small (< 5 mm) varices were found in the distal                            esophagus.                           Portal hypertensive gastropathy was found in the                            gastric body.                           Six 4 to 8 mm inflammatory sessile polyps with no                            bleeding and stigmata of recent bleeding were found                            in the gastric body, at the incisura and in the                            gastric antrum. These polyps were removed with a                            hot snare. Resection and retrieval were complete.                           Localized mild inflammation characterized by                            congestion (edema) and erythema was  found in the                            gastric antrum. Biopsies were taken with a cold                            forceps for histology.                           The examined duodenum was normal. Complications:            No immediate complications. Estimated Blood Loss:     Estimated blood loss was minimal. Impression:               - Small (< 5 mm) esophageal varices.                           - Portal hypertensive gastropathy.                           - Six inflammatory gastric polyps. Resected and                            retrieved.                           - Gastritis. Biopsied.                           - Normal examined duodenum. Recommendation:           - Patient's iron deficiency anemia may be due to                            bleeding due to inflammatory stomach polyps, which                            were removed today.                           - Await pathology results.                           - Perform a colonoscopy today. Dr Pedro Bourgeois "[name]" [name],  05/24/2023 2:43:31  PM

## 2023-05-24 NOTE — Progress Notes (Signed)
 GASTROENTEROLOGY PROCEDURE H&P NOTE   Primary Care Physician: Jobe Mulder, DO    Reason for Procedure:   IDA, EtOH cirrhosis  Plan:    EGD/colonoscopy  Patient is appropriate for endoscopic procedure(s) in the ambulatory ([facility]) setting.  The nature of the procedure, as well as the risks, benefits, and alternatives were carefully and thoroughly reviewed with the patient. Ample time for discussion and questions allowed. The patient understood, was satisfied, and agreed to proceed.     HPI: Cameron Woods is a 73 y.o. male who presents for EGD/colonoscopy for evaluation of IDA, EtOH cirrhosis.  Patient was most recently seen in the Gastroenterology Clinic on 04/19/23.  No interval change in medical history since that appointment. Please refer to that note for full details regarding GI history and clinical presentation.   Past Medical History:  Diagnosis Date   Aortic atherosclerosis (HCC)    GERD (gastroesophageal reflux disease)    Hyperlipidemia    Hypertension    NAFLD (nonalcoholic fatty liver disease)    Sleep apnea     Past Surgical History:  Procedure Laterality Date   COLONOSCOPY     TONSILLECTOMY     UMBILICAL HERNIA REPAIR     UPPER GASTROINTESTINAL ENDOSCOPY      Prior to Admission medications   Medication Sig Start Date End Date Taking? Authorizing Provider  clonazePAM (KLONOPIN) 0.5 MG tablet Take 0.5 mg by mouth.   Yes [provider]  famotidine  (PEPCID ) 20 MG tablet Take 1 tablet (20 mg total) by mouth at bedtime. 04/23/21  Yes Janel Medford, MD  nadolol  (CORGARD ) 20 MG tablet Take 1 tablet (20 mg total) by mouth daily. 03/28/22  Yes Daina Drum, MD  olmesartan  (BENICAR ) 20 MG tablet Take 1 tablet (20 mg total) by mouth daily. 02/23/23  Yes Jobe Mulder, DO  pantoprazole (PROTONIX) 40 MG tablet Take 1 tablet (40 mg total) by mouth 2 (two) times daily before a meal. 11/02/22  Yes Wendling, Shellie Dials, DO  rosuvastatin   (CRESTOR ) 20 MG tablet TAKE 1 TABLET BY MOUTH EVERY DAY FOR CHOLESTEROL 03/31/23  Yes Jobe Mulder, DO  aspirin  81 MG EC tablet Take 1 tablet (81 mg total) by mouth daily. Swallow whole. Patient not taking: Reported on 05/24/2023 08/07/20   Jobe Mulder, DO  ferrous sulfate  325 (65 FE) MG EC tablet Take 1 tablet (325 mg total) by mouth daily. 04/19/23   Daina Drum, MD    Current Outpatient Medications  Medication Sig Dispense Refill   clonazePAM (KLONOPIN) 0.5 MG tablet Take 0.5 mg by mouth.     famotidine  (PEPCID ) 20 MG tablet Take 1 tablet (20 mg total) by mouth at bedtime. 30 tablet 0   nadolol  (CORGARD ) 20 MG tablet Take 1 tablet (20 mg total) by mouth daily. 90 tablet 3   olmesartan  (BENICAR ) 20 MG tablet Take 1 tablet (20 mg total) by mouth daily. 90 tablet 3   pantoprazole (PROTONIX) 40 MG tablet Take 1 tablet (40 mg total) by mouth 2 (two) times daily before a meal. 180 tablet 1   rosuvastatin  (CRESTOR ) 20 MG tablet TAKE 1 TABLET BY MOUTH EVERY DAY FOR CHOLESTEROL 90 tablet 1   aspirin  81 MG EC tablet Take 1 tablet (81 mg total) by mouth daily. Swallow whole. (Patient not taking: Reported on 05/24/2023) 30 tablet 12   ferrous sulfate  325 (65 FE) MG EC tablet Take 1 tablet (325 mg total) by mouth daily. 30 tablet 3  Current Facility-Administered Medications  Medication Dose Route Frequency Provider Last Rate Last Admin   0.9 %  sodium chloride  infusion  500 mL Intravenous Once Daina Drum, MD        Allergies as of 05/24/2023   (No Known Allergies)    Family History  Problem Relation Age of Onset   Stroke Mother    Cancer Father        prostate cancer   Hypertension Sister    Cancer Brother        prostate cancer   Colon cancer Neg Hx    Esophageal cancer Neg Hx    Rectal cancer Neg Hx    Stomach cancer Neg Hx     Social History   Socioeconomic History   Marital status: Divorced    Spouse name: Not on file   Number of children: Not on file    Years of education: Not on file   Highest education level: Not on file  Occupational History   Not on file  Tobacco Use   Smoking status: Former   Smokeless tobacco: Current    Types: Chew  Vaping Use   Vaping status: Never Used  Substance and Sexual Activity   Alcohol use: Not Currently    Comment: not since 2023   Drug use: No   Sexual activity: Not on file  Other Topics Concern   Not on file  Social History Narrative   Not on file   Social Drivers of Health   Financial Resource Strain: Low Risk  (09/29/2022)   Received from [facility]   Overall Financial Resource Strain (CARDIA)    Difficulty of Paying Living Expenses: Not hard at all  Food Insecurity: No Food Insecurity (09/29/2022)   Received from [facility]   Hunger Vital Sign    Worried About Running Out of Food in the Last Year: Never true    Ran Out of Food in the Last Year: Never true  Transportation Needs: No Transportation Needs (09/29/2022)   Received from [facility] - Transportation    Lack of Transportation (Medical): No    Lack of Transportation (Non-Medical): No  Physical Activity: Inactive (10/08/2020)   Exercise Vital Sign    Days of Exercise per Week: 0 days    Minutes of Exercise per Session: 0 min  Stress: No Stress Concern Present (10/08/2020)   [employer] [facility] [employer] [facility] - Occupational Stress Questionnaire    Feeling of Stress : Not at all  Social Connections: Unknown (07/30/2022)   Received from [facility]   Social Network    Social Network: Not on file  Intimate Partner Violence: Unknown (07/30/2022)   Received from [facility]   HITS    Physically Hurt: Not on file    Insult or Talk Down To: Not on file    Threaten Physical Harm: Not on file    Scream or Curse: Not on file    Physical Exam: Vital signs in last 24 hours: BP 127/68   Pulse (!) 53   Temp 98.1 F (36.7 C)   Ht 5\' 5"  (1.651 m)   Wt 209 lb (94.8 kg)   SpO2 96%   BMI 34.78 kg/m   GEN: NAD EYE: Sclerae anicteric ENT: MMM CV: Non-tachycardic Pulm: No increased WOB GI: Soft NEURO:  Alert & Oriented   Regino Caprio, MD [facility]   05/24/2023 1:29 PM

## 2023-05-24 NOTE — Patient Instructions (Addendum)
 Await pathology results.                           - Return to GI clinic in 2-3 months for follow up                            of iron deficiency anemia.                           - The findings and recommendations were discussed                            with the patient.  Handout on polyps given.  Patient will call to schedule follow up 2-3 month appointment, he wants to check calendar at home      YOU HAD AN ENDOSCOPIC PROCEDURE TODAY AT THE  ENDOSCOPY CENTER:   Refer to the procedure report that was given to you for any specific questions about what was found during the examination.  If the procedure report does not answer your questions, please call your gastroenterologist to clarify.  If you requested that your care partner not be given the details of your procedure findings, then the procedure report has been included in a sealed envelope for you to review at your convenience later.  YOU SHOULD EXPECT: Some feelings of bloating in the abdomen. Passage of more gas than usual.  Walking can help get rid of the air that was put into your GI tract during the procedure and reduce the bloating. If you had a lower endoscopy (such as a colonoscopy or flexible sigmoidoscopy) you may notice spotting of blood in your stool or on the toilet paper. If you underwent a bowel prep for your procedure, you may not have a normal bowel movement for a few days.  Please Note:  You might notice some irritation and congestion in your nose or some drainage.  This is from the oxygen used during your procedure.  There is no need for concern and it should clear up in a day or so.  SYMPTOMS TO REPORT IMMEDIATELY:  Following lower endoscopy (colonoscopy or flexible sigmoidoscopy):  Excessive amounts of blood in the stool  Significant tenderness or worsening of abdominal pains  Swelling of the abdomen that is new, acute  Fever of 100F or higher  Following upper endoscopy (EGD)  Vomiting of blood or  coffee ground material  New chest pain or pain under the shoulder blades  Painful or persistently difficult swallowing  New shortness of breath  Fever of 100F or higher  Black, tarry-looking stools  For urgent or emergent issues, a gastroenterologist can be reached at any hour by calling [phone]. Do not use MyChart messaging for urgent concerns.    DIET:  We do recommend a small meal at first, but then you may proceed to your regular diet.  Drink plenty of fluids but you should avoid alcoholic beverages for 24 hours.  ACTIVITY:  You should plan to take it easy for the rest of today and you should NOT DRIVE or use heavy machinery until tomorrow (because of the sedation medicines used during the test).    FOLLOW UP: Our staff will call the number listed on your records the next business day following your procedure.  We will call around 7:15- 8:00 am to check on you  and address any questions or concerns that you may have regarding the information given to you following your procedure. If we do not reach you, we will leave a message.     If any biopsies were taken you will be contacted by phone or by letter within the next 1-3 weeks.  Please call us  at [phone] if you have not heard about the biopsies in 3 weeks.    SIGNATURES/CONFIDENTIALITY: You and/or your care partner have signed paperwork which will be entered into your electronic medical record.  These signatures attest to the fact that that the information above on your After Visit Summary has been reviewed and is understood.  Full responsibility of the confidentiality of this discharge information lies with you and/or your care-partner.

## 2023-05-24 NOTE — Progress Notes (Signed)
 Report to PACU, RN, vss, BBS= Clear.

## 2023-05-25 ENCOUNTER — Telehealth: Payer: Self-pay

## 2023-05-25 ENCOUNTER — Other Ambulatory Visit: Payer: Self-pay | Admitting: Family Medicine

## 2023-05-25 NOTE — Telephone Encounter (Signed)
  Follow up Call-     05/24/2023   12:57 PM 05/27/2021   10:17 AM  Call back number  Post procedure Call Back phone  # [phone]  Permission to leave phone message Yes Yes     Patient questions:  Do you have a fever, pain , or abdominal swelling? No. Pain Score  0 *  Have you tolerated food without any problems? Yes.    Have you been able to return to your normal activities? Yes.    Do you have any questions about your discharge instructions: Diet   No. Medications  No. Follow up visit  No.  Do you have questions or concerns about your Care? No.  Actions: * If pain score is 4 or above: No action needed, pain <4.

## 2023-05-26 ENCOUNTER — Telehealth: Payer: Self-pay | Admitting: Family Medicine

## 2023-05-26 NOTE — Telephone Encounter (Signed)
 Copied from CRM [id number]. Topic: Medicare AWV >> May 26, 2023  9:25 AM [name] wrote: Reason for CRM: LVM 05/26/2023 to schedule AWV. Please schedule Virtual or Telehealth visits ONLY.   Cameron Woods; Care Guide Ambulatory Clinical Support [place] [facility] Direct Dial: [phone]

## 2023-05-27 LAB — SURGICAL PATHOLOGY

## 2023-05-28 ENCOUNTER — Encounter: Payer: Self-pay | Admitting: Internal Medicine

## 2023-07-16 ENCOUNTER — Other Ambulatory Visit: Payer: Self-pay | Admitting: *Deleted

## 2023-07-16 MED ORDER — NADOLOL 20 MG PO TABS: 20.0000 mg | ORAL_TABLET | Freq: Every day | ORAL | 3 refills | Status: AC

## 2023-08-03 ENCOUNTER — Other Ambulatory Visit: Payer: Self-pay | Admitting: Internal Medicine

## 2023-08-12 ENCOUNTER — Ambulatory Visit: Admitting: Gastroenterology

## 2023-08-12 ENCOUNTER — Other Ambulatory Visit

## 2023-08-12 ENCOUNTER — Encounter: Payer: Self-pay | Admitting: Gastroenterology

## 2023-08-12 VITALS — BP 120/62 | HR 64 | Ht 65.0 in | Wt 207.5 lb

## 2023-08-12 DIAGNOSIS — F1091 Alcohol use, unspecified, in remission: Secondary | ICD-10-CM | POA: Diagnosis not present

## 2023-08-12 DIAGNOSIS — K7030 Alcoholic cirrhosis of liver without ascites: Secondary | ICD-10-CM

## 2023-08-12 DIAGNOSIS — D696 Thrombocytopenia, unspecified: Secondary | ICD-10-CM

## 2023-08-12 DIAGNOSIS — D509 Iron deficiency anemia, unspecified: Secondary | ICD-10-CM | POA: Diagnosis not present

## 2023-08-12 DIAGNOSIS — I8500 Esophageal varices without bleeding: Secondary | ICD-10-CM

## 2023-08-12 LAB — CBC WITH DIFFERENTIAL/PLATELET
Basophils Absolute: 0 K/uL (ref 0.0–0.1)
Basophils Relative: 0.8 % (ref 0.0–3.0)
Eosinophils Absolute: 0.1 K/uL (ref 0.0–0.7)
Eosinophils Relative: 2.9 % (ref 0.0–5.0)
HCT: 41 % (ref 39.0–52.0)
Hemoglobin: 13.7 g/dL (ref 13.0–17.0)
Lymphocytes Relative: 32.9 % (ref 12.0–46.0)
Lymphs Abs: 1.3 K/uL (ref 0.7–4.0)
MCHC: 33.5 g/dL (ref 30.0–36.0)
MCV: 89.8 fl (ref 78.0–100.0)
Monocytes Absolute: 0.3 K/uL (ref 0.1–1.0)
Monocytes Relative: 7.7 % (ref 3.0–12.0)
Neutro Abs: 2.1 K/uL (ref 1.4–7.7)
Neutrophils Relative %: 55.7 % (ref 43.0–77.0)
Platelets: 65 K/uL — ABNORMAL LOW (ref 150.0–400.0)
RBC: 4.56 Mil/uL (ref 4.22–5.81)
RDW: 15 % (ref 11.5–15.5)
WBC: 3.9 K/uL — ABNORMAL LOW (ref 4.0–10.5)

## 2023-08-12 LAB — COMPREHENSIVE METABOLIC PANEL WITH GFR
ALT: 31 U/L (ref 0–53)
AST: 38 U/L — ABNORMAL HIGH (ref 0–37)
Albumin: 4.2 g/dL (ref 3.5–5.2)
Alkaline Phosphatase: 117 U/L (ref 39–117)
BUN: 10 mg/dL (ref 6–23)
CO2: 27 meq/L (ref 19–32)
Calcium: 9 mg/dL (ref 8.4–10.5)
Chloride: 106 meq/L (ref 96–112)
Creatinine, Ser: 1.01 mg/dL (ref 0.40–1.50)
GFR: 75.17 mL/min (ref >60.00)
Glucose, Bld: 99 mg/dL (ref 70–99)
Potassium: 4.5 meq/L (ref 3.5–5.1)
Sodium: 136 meq/L (ref 135–145)
Total Bilirubin: 0.9 mg/dL (ref 0.2–1.2)
Total Protein: 6.6 g/dL (ref 6.0–8.3)

## 2023-08-12 LAB — PROTIME-INR
INR: 1.2 ratio — ABNORMAL HIGH (ref 0.8–1.0)
Prothrombin Time: 12.8 s (ref 9.6–13.1)

## 2023-08-12 NOTE — Progress Notes (Signed)
 I agree with the assessment and plan as outlined by Ms. May.

## 2023-08-12 NOTE — Progress Notes (Signed)
 Chief Complaint:follow-up IDA, alcoholic cirrhosis Primary GI Doctor:Dr. Federico  HPI: 73 year old male with history of alcoholic cirrhosis, GERD, and OSA presents for follow up of alcoholic cirrhosis. Last seen in GI office by Dr. Federico on 04/19/2023 for Iron deficiency anemia. 05/24/23 EGD/colonoscopy scheduled in [facility] for IDA, full report below.   Review of pertinent gastrointestinal problems:  1. Cirrhosis, likely from previous alcohol abuse. Ultrasound 04/27/2023: Cirrhotic liver. No focal liver lesion identified.  MELD-Na 7 (05/2021 labs) EGD 05/2023 :Small ( < 5 mm) esophageal varices. - Portal hypertensive gastropathy. - Six inflammatory gastric polyps. Resected and retrieved. - Gastritis. Biopsied. - Normal examined duodenum. Negative biopsies. Colonoscopy 05/2023: The examined portion of the ileum was normal. - Three non- bleeding colonic angioectasias. - Two 3 to 5 mm polyps in the transverse colon and in the ascending colon, removed with a cold snare. Resected and retrieved.- One 3 mm polyp in the sigmoid colon, removed with a cold snare. Resected and retrieved. - Non- bleeding internal hemorrhoids. Path: (2) TAs, 1 HPP- recall 7 years EGD 05/2021 small esophageal varices, mild portal gastropathy, multiple hyperplastic polyps throughout the stomach biopsy-proven. Blood work 05/2021: Immune to hepatitis A, normal TTG, ceruloplasmin normal, antimitochondrial antibody negative, alpha-1 antitrypsin level normal, ANA negative, ferritin normal, hepatitis C antibody negative, hepatitis B surface antibody nonreactive, hepatitis B surface antigen nonreactive, Alk phos chronically elevated around 1 30-200, AST intermittently elevated up to around 55. Last alcoholic beverage April 2023   2.  History of irregular Z-line, very short segment Barrett's (without dysplasia) however this was not seen on EGD 05/2023 or 05/2021.  EGD 12/2014 Dr. Dianna indication esophageal reflux, Barrett's esophagus.   Findings 1 cm of Barrett's appearing mucosa just above the GE junction.  Gastritis.  Esophagus was biopsied and this showed intestinal metaplasia consistent with Barrett's esophagus.  Also reflux damage.  EGD 03/2018 Dr. Dianna indication esophageal reflux, Barrett's esophagus.  Findings exactly the same as the previous findings, a very short segment Barrett's appearing mucosa at the GE junction.  Pathology Barrett's mucosa.  Negative for dysplasia.  (1 fragment of tissue)   3.  Colonoscopy screening up to date. (2) TA s on colonscopy 05/2023, recall colonoscopy in 7 years.  Interval History    Patient presents for follow-up on iron deficiency anemia.  I have reviewed his endoscopic procedures as well as ultrasound results. Patient reports he stopped his baby ASA 81mg  po daily few months ago on own because he states it would cause his hemorrhoids to bleed every time he had bowel movement.  He states when he is off the baby aspirin  he does not have any bleeding.  He has appointment with PCP in a few weeks and plan to notify him as a prescriber. No diarrhea or constipation. He eats high fiber cereal daily which regulates his bowels. He is currently taking Iron 1 tablet po daily. He has not had any issues taking the iron.  Patient works outside and [employer] his yard and his brothers yard. Denies lightheaded or shortness of breath. Denies confusion. Denies swelling in his legs or abdomen. He has not had any alcohol for the last 2 years. He has been good about taking his nadolol . Denies NSAID use.    Wt Readings from Last 3 Encounters:  08/12/23 207 lb 8 oz (94.1 kg)  05/24/23 209 lb (94.8 kg)  04/19/23 209 lb (94.8 kg)     Past Medical History:  Diagnosis Date   Aortic atherosclerosis (HCC)  GERD (gastroesophageal reflux disease)    Hyperlipidemia    Hypertension    NAFLD (nonalcoholic fatty liver disease)    Sleep apnea     Past Surgical History:  Procedure Laterality Date   COLONOSCOPY      TONSILLECTOMY     UMBILICAL HERNIA REPAIR     UPPER GASTROINTESTINAL ENDOSCOPY      Current Outpatient Medications  Medication Sig Dispense Refill   clonazePAM (KLONOPIN) 0.5 MG tablet Take 0.5 mg by mouth.     famotidine  (PEPCID ) 20 MG tablet Take 1 tablet (20 mg total) by mouth at bedtime. 30 tablet 0   ferrous sulfate  (FEROSUL) 325 (65 FE) MG tablet TAKE 1 TABLET BY MOUTH EACH DAY FOR IRON 30 tablet 2   nadolol  (CORGARD ) 20 MG tablet Take 1 tablet (20 mg total) by mouth daily. 90 tablet 3   olmesartan  (BENICAR ) 20 MG tablet Take 1 tablet (20 mg total) by mouth daily. 90 tablet 3   pantoprazole (PROTONIX) 40 MG tablet TAKE 1 TABLET BY MOUTH 2 TIMES A DAY BEFORE A MEAL FOR ACID REFLUX 180 tablet 1   rosuvastatin  (CRESTOR ) 20 MG tablet TAKE 1 TABLET BY MOUTH EVERY DAY FOR CHOLESTEROL 90 tablet 1   No current facility-administered medications for this visit.    Allergies as of 08/12/2023   (No Known Allergies)    Family History  Problem Relation Age of Onset   Stroke Mother    Cancer Father        prostate cancer   Hypertension Sister    Cancer Brother        prostate cancer   Colon cancer Neg Hx    Esophageal cancer Neg Hx    Rectal cancer Neg Hx    Stomach cancer Neg Hx     Review of Systems:    Constitutional: No weight loss, fever, chills, weakness or fatigue HEENT: Eyes: No change in vision               Ears, Nose, Throat:  No change in hearing or congestion Skin: No rash or itching Cardiovascular: No chest pain, chest pressure or palpitations   Respiratory: No SOB or cough Gastrointestinal: See HPI and otherwise negative Genitourinary: No dysuria or change in urinary frequency Neurological: No headache, dizziness or syncope Musculoskeletal: No new muscle or joint pain Hematologic: No bleeding or bruising Psychiatric: No history of depression or anxiety    Physical Exam:  Vital signs: BP 120/62   Pulse 64   Ht 5' 5 (1.651 m)   Wt 207 lb 8 oz (94.1 kg)    BMI 34.53 kg/m   Constitutional:   Pleasant male appears to be in NAD, Well developed, Well nourished, alert and cooperative Throat: Oral cavity and pharynx without inflammation, swelling or lesion.  Respiratory: Respirations even and unlabored. Lungs clear to auscultation bilaterally.   No wheezes, crackles, or rhonchi.  Cardiovascular: Normal S1, S2. Regular rate and rhythm. No peripheral edema, cyanosis or pallor.  Gastrointestinal:  Soft, nondistended, nontender. No rebound or guarding. Normal bowel sounds. No appreciable masses or hepatomegaly. Rectal:  Not performed.  Msk:  Symmetrical without gross deformities. Without edema, no deformity or joint abnormality.  Neurologic:  Alert and  oriented x4;  grossly normal neurologically.  Skin:   Dry and intact without significant lesions or rashes. Psychiatric: Oriented to person, place and time. Demonstrates good judgement and reason without abnormal affect or behaviors.  RELEVANT LABS AND IMAGING: CBC    Latest Ref Rng &  Units 03/17/2023    1:39 PM 02/23/2023    2:06 PM 02/09/2022    1:03 PM  CBC  WBC 3.8 - 10.8 Thousand/uL 4.1  3.9  9.1   Hemoglobin 13.2 - 17.1 g/dL 89.0  89.1  84.7   Hematocrit 38.5 - 50.0 % 34.0  33.2  44.3   Platelets 140 - 400 Thousand/uL 92  86  111.0      CMP     Latest Ref Rng & Units 02/23/2023    2:06 PM 08/10/2022    1:10 PM 02/09/2022    1:03 PM  CMP  Glucose 65 - 99 mg/dL 92  93  92   BUN 7 - 25 mg/dL 12  16  16    Creatinine 0.70 - 1.28 mg/dL 9.06  9.01  9.04   Sodium 135 - 146 mmol/L 140  139  140   Potassium 3.5 - 5.3 mmol/L 4.5  4.6  4.2   Chloride 98 - 110 mmol/L 106  105  103   CO2 20 - 32 mmol/L 27  26  30    Calcium  8.6 - 10.3 mg/dL 9.3  9.2  9.2   Total Protein 6.1 - 8.1 g/dL 6.4  6.6  6.8   Total Bilirubin 0.2 - 1.2 mg/dL 0.6  1.0  0.9   Alkaline Phos 39 - 117 U/L  90  120   AST 10 - 35 U/L 29  23  28    ALT 9 - 46 U/L 25  16  31    07/22/21 AFP tumor marker 3.5   Labs 02/2022: CBC with  low plts of 111. CMP with mildly elevated alk phos of 120.   Labs 02/2023: CBC with low Hb of 10.8 and low plts 86. CMP nml.    Labs 04/2023: CBC with low Hb of 10.9 and low plts 92. Ferritin low at 6.8.   Imaging: Ultrasound 04/27/2023: Cirrhotic liver. No focal liver lesion identified.  Ab U/S 05/08/21: IMPRESSION: Nodularity in the liver surface suggests cirrhosis.  Splenomegaly. Abdominal sonogram is otherwise unremarkable.  Procedures: EGD 05/2023 for IDA : - Small ( < 5 mm) esophageal varices.  - Portal hypertensive gastropathy.  - Six inflammatory gastric polyps. Resected and retrieved.  - Gastritis. Biopsied.  - Normal examined duodenum. Negative biopsies. Colonoscopy 05/2023 for IDA:  - The examined portion of the ileum was normal.  - Three non- bleeding colonic angioectasias.  - Two 3 to 5 mm polyps in the transverse colon and in the ascending colon, removed with a cold snare. Resected and retrieved. - One 3 mm polyp in the sigmoid colon, removed with a cold snare. Resected and retrieved.  - Non- bleeding internal hemorrhoids. Path: (2) TAs, 1 HPP- recall 7 years   EGD 05/27/21: - Two trunks of small esophageal varices without signs of recent bleeding. - Mild inflammation characterized by erythema and friability was found in the gastric antrum. Biopsies were taken with a cold forceps for histology. - Numerous (25-30) soft, fleshy, friable gastric polyps in the proximal 2/3 of the stomach. These ranged in size from 5mm to 2cm. They are likely hyperplastic polyps, I sampled several with forceps. - Changes of mild portal gastropathy throughout the proximal stomach. No gastric varices 1. Surgical [P], gastric antrum REACTIVE GASTROPATHY NEGATIVE FOR H. PYLORI, INTESTINAL METAPLASIA, DYSPLASIA AND CARCINOMA 2. Surgical [P], gastric polyps COMPATIBLE WITH ULCERATED HYPERPLASTIC POLYP REACTIVE GASTROPATHY WITH FOVEOLAR HYPERPLASIA AND EROSION/ULCER WITH ULCER SLOUGH NEGATIVE FOR H.  PYLORI, IN   Assessment: Encounter  Diagnoses  Name Primary?   Alcoholic cirrhosis, unspecified whether ascites present (HCC) Yes   Iron deficiency anemia, unspecified iron deficiency anemia type    Esophageal varices without bleeding, unspecified esophageal varices type (HCC)    Thrombocytopenia (HCC)      Patient presents for follow-up on iron deficiency anemia.  EGD/colon with suspected iron deficiency Cameron Woods be due to bleeding due to inflammatory stomach polyps.  Patient does note he has intermittent BRB with wiping from hemorrhoids if he is on his daily baby aspirin  81 mg p.o. daily patient states he has stopped it couple months ago.  Patient taking oral iron supplementation.  Will go ahead and recheck his iron levels today.    Patient also has history of alcoholic cirrhosis which appears to be well compensated at this time.  Has not had any alcohol in over 2 years.  Will recheck labs today with LFTs, PT/INR and AFP.  Abdominal ultrasound/HCC screening up-to-date.  EGD for variceal screening also up-to-date.  Plan: -  CBC, CMP, Iron panel/TIBC, PT/INR, AFP -Cont nadolol  20 mg QD. - Continue ferrous sulfate  325 mg every day. Okay to use daily stool softener or laxative if constipation arises. - Will plan for RUQ U/S for HCC screening (10/2023) -recall colonoscopy 05/2030 -follow-up 6 mths with Dr. Federico   Thank you for the courtesy of this consult. Please call me with any questions or concerns.   Cherelle Midkiff, FNP-C [facility] 08/12/2023, 1:24 PM  Cc: Frann Mabel Mt*

## 2023-08-12 NOTE — Patient Instructions (Signed)
 Your provider has requested that you go to the basement level for lab work before leaving today. Press B on the elevator. The lab is located at the first door on the left as you exit the elevator.  _______________________________________________________  If your blood pressure at your visit was 140/90 or greater, please contact your primary care physician to follow up on this.  _______________________________________________________  If you are age 71 or older, your body mass index should be between 23-30. Your Body mass index is 34.53 kg/m. If this is out of the aforementioned range listed, please consider follow up with your Primary Care Provider.  If you are age 42 or younger, your body mass index should be between 19-25. Your Body mass index is 34.53 kg/m. If this is out of the aformentioned range listed, please consider follow up with your Primary Care Provider.   ________________________________________________________  The [facility] providers would like to encourage you to use MYCHART to communicate with providers for non-urgent requests or questions.  Due to long hold times on the telephone, sending your provider a message by [facility] may be a faster and more efficient way to get a response.  Please allow 48 business hours for a response.  Please remember that this is for non-urgent requests.  _______________________________________________________  Thank you for trusting me with your gastrointestinal care. Deanna May, RNP

## 2023-08-13 LAB — AFP TUMOR MARKER: AFP-Tumor Marker: 3.2 ng/mL (ref <6.1)

## 2023-08-19 ENCOUNTER — Ambulatory Visit: Payer: Self-pay | Admitting: Gastroenterology

## 2023-08-23 ENCOUNTER — Ambulatory Visit: Payer: Medicare HMO | Admitting: Family Medicine

## 2023-08-24 ENCOUNTER — Ambulatory Visit: Admitting: Family Medicine

## 2023-08-24 ENCOUNTER — Ambulatory Visit: Payer: Self-pay | Admitting: Family Medicine

## 2023-08-24 ENCOUNTER — Encounter: Payer: Self-pay | Admitting: Family Medicine

## 2023-08-24 VITALS — BP 126/68 | HR 62 | Temp 98.0°F | Resp 16 | Ht 65.0 in | Wt 209.8 lb

## 2023-08-24 DIAGNOSIS — Z125 Encounter for screening for malignant neoplasm of prostate: Secondary | ICD-10-CM

## 2023-08-24 DIAGNOSIS — E782 Mixed hyperlipidemia: Secondary | ICD-10-CM

## 2023-08-24 DIAGNOSIS — I10 Essential (primary) hypertension: Secondary | ICD-10-CM | POA: Diagnosis not present

## 2023-08-24 DIAGNOSIS — K219 Gastro-esophageal reflux disease without esophagitis: Secondary | ICD-10-CM | POA: Diagnosis not present

## 2023-08-24 DIAGNOSIS — I700 Atherosclerosis of aorta: Secondary | ICD-10-CM

## 2023-08-24 LAB — LIPID PANEL
Cholesterol: 109 mg/dL (ref 0–200)
HDL: 42 mg/dL (ref >39.00)
LDL Cholesterol: 50 mg/dL (ref 0–99)
NonHDL: 66.63
Total CHOL/HDL Ratio: 3
Triglycerides: 81 mg/dL (ref 0.0–149.0)
VLDL: 16.2 mg/dL (ref 0.0–40.0)

## 2023-08-24 LAB — PSA, MEDICARE: PSA: 2.01 ng/mL (ref 0.10–4.00)

## 2023-08-24 NOTE — Patient Instructions (Addendum)
 Cut your olmesartan  in half and keep an eye on your blood pressure.   Keep the diet clean and stay active.  Give us  2-3 business days to get the results of your labs back.   Send me a message if you would like a referral to an audiologist.   Let us  know if you need anything.

## 2023-08-24 NOTE — Progress Notes (Signed)
 Chief Complaint  Patient presents with   Medication Refill    Medication Refill    Subjective Cameron Woods is a 73 y.o. male who presents for hypertension follow up. He does monitor home blood pressures. Blood pressures ranging from 100's/60's on average. He is compliant with medication- Benicar  20 mg/d. Patient has these side effects of medication: none He is adhering to a healthy diet overall. Current exercise: active in yard No CP or SOB.   Hyperlipidemia Patient presents for mixed hyper lipidemia follow up. Currently being treated with Crestor  20 mg/d and compliance with treatment thus far has been good. He denies myalgias. Diet/exercise as above. The patient is not known to have coexisting coronary artery disease.  GERD Patient has a history of GERD taking Protonix 40 mg daily.  Compliant, no adverse effects.  Symptoms are well-controlled.  Denies dysphagia, bleeding, unintentional weight loss.   Past Medical History:  Diagnosis Date   Aortic atherosclerosis (HCC)    GERD (gastroesophageal reflux disease)    Hyperlipidemia    Hypertension    NAFLD (nonalcoholic fatty liver disease)    Sleep apnea     Exam BP 126/68 (BP Location: Left Arm, Patient Position: Sitting)   Pulse 62   Temp 98 F (36.7 C) (Oral)   Resp 16   Ht 5' 5 (1.651 m)   Wt 209 lb 12.8 oz (95.2 kg)   SpO2 96%   BMI 34.91 kg/m  General:  well developed, well nourished, in no apparent distress Heart: RRR, no bruits, no LE edema Lungs: clear to auscultation, no accessory muscle use Abdomen: Bowel sounds present, soft, nontender, nondistended Psych: well oriented with normal range of affect and appropriate judgment/insight  Essential hypertension  Mixed hyperlipidemia - Plan: Lipid panel  Gastroesophageal reflux disease, unspecified whether esophagitis present  Aortic atherosclerosis (HCC)  Screening for prostate cancer - Plan: PSA, Medicare (  [facility] only)  Chronic, stable.  Cut  Benicar  10 mg daily. Counseled on diet and exercise. Chronic, stable. Cont Crestor  20 mg/d.  Chronic, stable. Cont Protonix 40 mg/d.  F/u in 6 mo. The patient voiced understanding and agreement to the plan.  Mabel Mt [place], DO 08/24/23  9:50 AM

## 2023-10-01 ENCOUNTER — Other Ambulatory Visit: Payer: Self-pay | Admitting: Family Medicine

## 2023-10-11 DIAGNOSIS — G4700 Insomnia, unspecified: Secondary | ICD-10-CM | POA: Diagnosis not present

## 2023-10-11 DIAGNOSIS — G4733 Obstructive sleep apnea (adult) (pediatric): Secondary | ICD-10-CM | POA: Diagnosis not present

## 2023-11-02 ENCOUNTER — Other Ambulatory Visit: Payer: Self-pay | Admitting: Family Medicine

## 2023-11-02 DIAGNOSIS — I10 Essential (primary) hypertension: Secondary | ICD-10-CM

## 2023-11-09 DIAGNOSIS — Z125 Encounter for screening for malignant neoplasm of prostate: Secondary | ICD-10-CM | POA: Diagnosis not present

## 2023-11-09 DIAGNOSIS — R3915 Urgency of urination: Secondary | ICD-10-CM | POA: Diagnosis not present

## 2023-12-02 ENCOUNTER — Other Ambulatory Visit: Payer: Self-pay | Admitting: Family Medicine

## 2024-01-03 ENCOUNTER — Other Ambulatory Visit: Payer: Self-pay | Admitting: Family Medicine

## 2024-02-25 ENCOUNTER — Encounter: Payer: Self-pay | Admitting: Family Medicine

## 2024-02-25 ENCOUNTER — Ambulatory Visit: Payer: Self-pay | Admitting: Family Medicine

## 2024-02-25 ENCOUNTER — Ambulatory Visit: Admitting: Family Medicine

## 2024-02-25 VITALS — BP 120/70 | HR 82 | Temp 98.0°F | Resp 16 | Ht 65.0 in | Wt 209.4 lb

## 2024-02-25 DIAGNOSIS — I10 Essential (primary) hypertension: Secondary | ICD-10-CM

## 2024-02-25 DIAGNOSIS — Z0000 Encounter for general adult medical examination without abnormal findings: Secondary | ICD-10-CM | POA: Diagnosis not present

## 2024-02-25 LAB — COMPREHENSIVE METABOLIC PANEL WITH GFR
ALT: 21 U/L (ref 3–53)
AST: 29 U/L (ref 5–37)
Albumin: 4 g/dL (ref 3.5–5.2)
Alkaline Phosphatase: 107 U/L (ref 39–117)
BUN: 14 mg/dL (ref 6–23)
CO2: 28 meq/L (ref 19–32)
Calcium: 9.1 mg/dL (ref 8.4–10.5)
Chloride: 104 meq/L (ref 96–112)
Creatinine, Ser: 0.97 mg/dL (ref 0.40–1.50)
GFR: 78.61 mL/min
Glucose, Bld: 92 mg/dL (ref 70–99)
Potassium: 4.1 meq/L (ref 3.5–5.1)
Sodium: 137 meq/L (ref 135–145)
Total Bilirubin: 0.7 mg/dL (ref 0.2–1.2)
Total Protein: 6.2 g/dL (ref 6.0–8.3)

## 2024-02-25 LAB — LIPID PANEL
Cholesterol: 129 mg/dL (ref 28–200)
HDL: 32.6 mg/dL — ABNORMAL LOW
LDL Cholesterol: 69 mg/dL (ref 10–99)
NonHDL: 96.46
Total CHOL/HDL Ratio: 4
Triglycerides: 136 mg/dL (ref 10.0–149.0)
VLDL: 27.2 mg/dL (ref 0.0–40.0)

## 2024-02-25 LAB — CBC
HCT: 41.1 % (ref 39.0–52.0)
Hemoglobin: 14 g/dL (ref 13.0–17.0)
MCHC: 34.2 g/dL (ref 30.0–36.0)
MCV: 92 fl (ref 78.0–100.0)
Platelets: 60 K/uL — ABNORMAL LOW (ref 150.0–400.0)
RBC: 4.46 Mil/uL (ref 4.22–5.81)
RDW: 13.5 % (ref 11.5–15.5)
WBC: 3.4 K/uL — ABNORMAL LOW (ref 4.0–10.5)

## 2024-02-25 NOTE — Progress Notes (Signed)
 Chief Complaint  Patient presents with   Annual Exam    CPE    Well Male Cameron Woods is here for a complete physical.   His last physical was >1 year ago.  Current diet: in general, a healthy diet.   Current exercise: active in yard Weight trend: stable Fatigue out of ordinary? No. Seat belt? Yes.  Advanced directive? Yes  Health maintenance Shingrix- No Colonoscopy- Yes Tetanus- Yes Hep C- Yes Pneumonia vaccine- Yes  Past Medical History:  Diagnosis Date   Aortic atherosclerosis    GERD (gastroesophageal reflux disease)    Hyperlipidemia    Hypertension    NAFLD (nonalcoholic fatty liver disease)    Sleep apnea      Past Surgical History:  Procedure Laterality Date   COLONOSCOPY     TONSILLECTOMY     UMBILICAL HERNIA REPAIR     UPPER GASTROINTESTINAL ENDOSCOPY      Medications  Medications Ordered Prior to Encounter[1]   Allergies Allergies[2]  Family History Family History  Problem Relation Age of Onset   Stroke Mother    Cancer Father        prostate cancer   Hypertension Sister    Cancer Brother        prostate cancer   Colon cancer Neg Hx    Esophageal cancer Neg Hx    Rectal cancer Neg Hx    Stomach cancer Neg Hx     Review of Systems: Constitutional:  no fevers Eye:  no recent significant change in vision Ears:  No changes in hearing Nose/Mouth/Throat:  no complaints of nasal congestion, no sore throat Cardiovascular: no chest pain Respiratory:  No shortness of breath Gastrointestinal:  No change in bowel habits GU:  No frequency Integumentary:  no abnormal skin lesions reported Neurologic:  no headaches Endocrine:  denies unexplained weight changes  Exam BP 120/70 (BP Location: Left Arm, Patient Position: Sitting)   Pulse 82   Temp 98 F (36.7 C) (Oral)   Resp 16   Ht 5' 5 (1.651 m)   Wt 209 lb 6.4 oz (95 kg)   SpO2 96%   BMI 34.85 kg/m  General:  well developed, well nourished, in no apparent distress Skin:  no  significant moles, warts, or growths Head:  no masses, lesions, or tenderness Eyes:  pupils equal and round, sclera anicteric without injection Ears:  canals without lesions, TMs shiny without retraction, no obvious effusion, no erythema Nose:  nares patent, mucosa normal Throat/Pharynx:  lips and gingiva without lesion; tongue and uvula midline; non-inflamed pharynx; no exudates or postnasal drainage Lungs:  clear to auscultation, breath sounds equal bilaterally, no respiratory distress Cardio:  regular rate and rhythm, no LE edema or bruits Rectal: Deferred GI: BS+, S, NT, ND, no masses or organomegaly Musculoskeletal:  symmetrical muscle groups noted without atrophy or deformity Neuro:  gait normal; deep tendon reflexes normal and symmetric Psych: well oriented with normal range of affect and appropriate judgment/insight  Assessment and Plan  Well adult exam  Essential hypertension - Plan: CBC, Comprehensive metabolic panel with GFR, Lipid panel   Well 74 y.o. male. Counseled on diet and exercise. Advanced directive form provided today.  Flu shot and Shingrix politely declined.  Other orders as above. Follow up in 6 mo.  The patient voiced understanding and agreement to the plan.  Cameron Mt [place], DO 02/25/24 8:20 AM     [1]  Current Outpatient Medications on File Prior to Visit  Medication Sig Dispense  Refill   olmesartan  (BENICAR ) 20 MG tablet Take 1 tablet (20 mg total) by mouth daily. 90 tablet 1   clonazePAM (KLONOPIN) 0.5 MG tablet Take 0.5 mg by mouth.     famotidine  (PEPCID ) 20 MG tablet Take 1 tablet (20 mg total) by mouth at bedtime. 30 tablet 0   ferrous sulfate  (FEROSUL) 325 (65 FE) MG tablet TAKE 1 TABLET BY MOUTH EACH DAY FOR IRON 30 tablet 2   nadolol  (CORGARD ) 20 MG tablet Take 1 tablet (20 mg total) by mouth daily. 90 tablet 3   pantoprazole (PROTONIX) 40 MG tablet Take 1 tablet (40 mg total) by mouth 2 (two) times daily before a meal. 180 tablet 1    rosuvastatin  (CRESTOR ) 20 MG tablet Take 1 tablet (20 mg total) by mouth daily. 90 tablet 1   No current facility-administered medications on file prior to visit.  [2] No Known Allergies

## 2024-02-25 NOTE — Patient Instructions (Signed)
 Give [place] 2-3 business days to get the results of your labs back.   Keep the diet clean and stay active.  Please get me a copy of your advanced directive form at your convenience.   Let us know if you need anything.

## 2024-03-01 ENCOUNTER — Other Ambulatory Visit: Payer: Self-pay | Admitting: Family Medicine

## 2024-08-25 ENCOUNTER — Ambulatory Visit: Admitting: Family Medicine
